# Patient Record
Sex: Female | Born: 1995 | Race: White | Hispanic: No | Marital: Single | Smoking: Never smoker
Health system: Southern US, Community
[De-identification: ages and names within clinical notes are randomized; demographics above are authoritative.]

## PROBLEM LIST (undated history)

## (undated) ENCOUNTER — Emergency Department (HOSPITAL_COMMUNITY): Admission: EM | Discharge status: Absent without leave | Payer: Self-pay

## (undated) DIAGNOSIS — N200 Calculus of kidney: Secondary | ICD-10-CM

## (undated) DIAGNOSIS — J45909 Unspecified asthma, uncomplicated: Secondary | ICD-10-CM

## (undated) DIAGNOSIS — F3181 Bipolar II disorder: Secondary | ICD-10-CM

## (undated) DIAGNOSIS — E282 Polycystic ovarian syndrome: Secondary | ICD-10-CM

## (undated) HISTORY — DX: Bipolar II disorder: F31.81

## (undated) HISTORY — DX: Polycystic ovarian syndrome: E28.2

---
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
## 2013-01-18 ENCOUNTER — Telehealth: Payer: Self-pay | Admitting: Family Medicine

## 2013-01-18 NOTE — Telephone Encounter (Signed)
Okay to schedule

## 2013-01-18 NOTE — Telephone Encounter (Signed)
Pt has sore throat, swollen glands, not well. Coming home form college fri and needs Fri afternoon appt. Pls advise ok to schedule?

## 2013-01-18 NOTE — Telephone Encounter (Signed)
appt made

## 2013-01-20 ENCOUNTER — Encounter: Payer: Self-pay | Admitting: Family Medicine

## 2013-01-20 ENCOUNTER — Ambulatory Visit (INDEPENDENT_AMBULATORY_CARE_PROVIDER_SITE_OTHER): Payer: Managed Care, Other (non HMO) | Admitting: Family Medicine

## 2013-01-20 ENCOUNTER — Ambulatory Visit: Payer: Self-pay | Admitting: Family Medicine

## 2013-01-20 VITALS — BP 120/60 | HR 116 | Temp 99.3°F | Wt 263.0 lb

## 2013-01-20 DIAGNOSIS — J019 Acute sinusitis, unspecified: Secondary | ICD-10-CM

## 2013-01-20 MED ORDER — PROMETHAZINE HCL 25 MG PO TABS: 25.0000 mg | ORAL_TABLET | ORAL | Status: DC | PRN

## 2013-01-20 MED ORDER — AZITHROMYCIN 250 MG PO TABS: ORAL_TABLET | ORAL | Status: DC

## 2013-01-20 NOTE — Progress Notes (Signed)
  Subjective:    Patient ID: Kristi Ewing, female    DOB: [birth year], 17 y.o.   MRN: [id number]  HPI Here for 5 days of fever, sinus pressure, PND, ST, and a dry cough.    Review of Systems  Constitutional: Positive for fever and fatigue.  HENT: Positive for congestion, postnasal drip and sinus pressure.   Eyes: Negative.   Respiratory: Positive for cough.        Objective:   Physical Exam  Constitutional: She appears well-developed and well-nourished.  HENT:  Right Ear: External ear normal.  Left Ear: External ear normal.  Nose: Nose normal.  Mouth/Throat: Oropharynx is clear and moist.  Eyes: Conjunctivae are normal.  Pulmonary/Chest: Effort normal and breath sounds normal.  Lymphadenopathy:    She has no cervical adenopathy.          Assessment & Plan:  Add Mucinex prn

## 2013-01-30 ENCOUNTER — Telehealth: Payer: Self-pay

## 2013-01-30 NOTE — Telephone Encounter (Signed)
Pt was given a z-pak last week and now has a rash on rt side of her neck. Mom wants to know what she should do because is away at college

## 2013-01-30 NOTE — Telephone Encounter (Signed)
It is hard to say what this rash is from. I doubt it is an allergic reaction to the Zpack she took because it is too localized and not all over her body. Tell her to take some Benadryl every 4 hours and let us know if anything else changes

## 2013-01-30 NOTE — Telephone Encounter (Signed)
I left voice message with below information. 

## 2013-02-17 ENCOUNTER — Ambulatory Visit (INDEPENDENT_AMBULATORY_CARE_PROVIDER_SITE_OTHER): Payer: Managed Care, Other (non HMO)

## 2013-02-17 DIAGNOSIS — Z23 Encounter for immunization: Secondary | ICD-10-CM

## 2013-09-12 ENCOUNTER — Telehealth: Payer: Self-pay | Admitting: Family Medicine

## 2013-09-12 NOTE — Telephone Encounter (Signed)
Dad states pt hurt her knee at school. Won't be home until Friday afternoon. Would like an appt to get knee checked out. Ok to schedule?

## 2013-09-12 NOTE — Telephone Encounter (Signed)
Pt aware.

## 2013-09-12 NOTE — Telephone Encounter (Signed)
LMOM for dad aout appt on Fri. Scheduled appt

## 2013-09-12 NOTE — Telephone Encounter (Signed)
Okay to schedule

## 2013-09-15 ENCOUNTER — Ambulatory Visit: Payer: Managed Care, Other (non HMO) | Admitting: Family Medicine

## 2013-09-16 ENCOUNTER — Encounter: Payer: Self-pay | Admitting: Family Medicine

## 2013-09-16 ENCOUNTER — Ambulatory Visit (INDEPENDENT_AMBULATORY_CARE_PROVIDER_SITE_OTHER): Payer: Managed Care, Other (non HMO) | Admitting: Family Medicine

## 2013-09-16 VITALS — BP 100/64 | HR 61 | Temp 98.3°F | Wt 291.0 lb

## 2013-09-16 DIAGNOSIS — S8390XA Sprain of unspecified site of unspecified knee, initial encounter: Secondary | ICD-10-CM

## 2013-09-16 DIAGNOSIS — IMO0002 Reserved for concepts with insufficient information to code with codable children: Secondary | ICD-10-CM

## 2013-09-16 NOTE — Progress Notes (Signed)
   Subjective:    Patient ID: Kristi Ewing, female    DOB: [birth year], 18 y.o.   MRN: [id number]  HPI Here for pain and swelling in the left knee after an injury at home about 10 days ago. She was stooping over to pick up something on the floor when she heard and felt a "pop" in the knee. She immediately felt pain in the anterior knee and it swelled up. Since then the knee swells every day toward the end of the day and then this is down the next morning. It does not give way but it feels unstable to her. It locks in position briefly at times. She is using ice and Advil .    Review of Systems  Constitutional: Negative.   Musculoskeletal: Positive for arthralgias and joint swelling.       Objective:   Physical Exam  Constitutional: She appears well-developed and well-nourished.  Walks with a slight limp.   Musculoskeletal:  The left knee shows no obvious swelling. No erythema. She is tender around the patella and in both joint spaces. She has full extension but flexion is limited by pain. Anterior drawer is negative.           Assessment & Plan:  Knee sprain, possibly a torn meniscus. She will take it easy this weekend. Wear a knee brace for support. We will refer her to see Orthopedics next week.

## 2013-11-06 ENCOUNTER — Encounter: Payer: Self-pay | Admitting: Family Medicine

## 2013-11-06 ENCOUNTER — Ambulatory Visit (INDEPENDENT_AMBULATORY_CARE_PROVIDER_SITE_OTHER): Payer: Managed Care, Other (non HMO) | Admitting: Family Medicine

## 2013-11-06 VITALS — BP 100/58 | HR 96 | Temp 98.8°F | Ht 67.25 in | Wt 296.8 lb

## 2013-11-06 DIAGNOSIS — Z23 Encounter for immunization: Secondary | ICD-10-CM

## 2013-11-06 DIAGNOSIS — M25569 Pain in unspecified knee: Secondary | ICD-10-CM

## 2013-11-06 DIAGNOSIS — Z111 Encounter for screening for respiratory tuberculosis: Secondary | ICD-10-CM

## 2013-11-06 DIAGNOSIS — Z0000 Encounter for general adult medical examination without abnormal findings: Secondary | ICD-10-CM

## 2013-11-06 LAB — POCT URINALYSIS DIPSTICK
BILIRUBIN UA: NEGATIVE
Glucose, UA: NEGATIVE
KETONES UA: NEGATIVE
Nitrite, UA: NEGATIVE
Protein, UA: NEGATIVE
Spec Grav, UA: >=1.03
Urobilinogen, UA: 0.2
pH, UA: 6

## 2013-11-06 NOTE — Progress Notes (Signed)
   Subjective:    Patient ID: Kristi Ewing, female    DOB: July 19, 1995, 18 y.o.   MRN: [id number]  HPI 18 yr old female for a cpx and for several other issues. 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 She needs some forms filled out for this. Also she was here 2 months ago for an injury to the right knee. She has not improved at all and she still has pain in this knee every day.    Review of Systems  Constitutional: Negative.   HENT: Negative.   Eyes: Negative.   Respiratory: Negative.   Cardiovascular: Negative.   Gastrointestinal: Negative.   Genitourinary: Negative for dysuria, urgency, frequency, hematuria, flank pain, decreased urine volume, enuresis, difficulty urinating, pelvic pain and dyspareunia.  Musculoskeletal: Positive for arthralgias. Negative for back pain, gait problem, joint swelling, myalgias, neck pain and neck stiffness.  Skin: Negative.   Neurological: Negative.   Psychiatric/Behavioral: Negative.        Objective:   Physical Exam  Constitutional: She is oriented to person, place, and time. She appears well-developed and well-nourished. No distress.  HENT:  Head: Normocephalic and atraumatic.  Right Ear: External ear normal.  Left Ear: External ear normal.  Nose: Nose normal.  Mouth/Throat: Oropharynx is clear and moist. No oropharyngeal exudate.  Eyes: Conjunctivae and EOM are normal. Pupils are equal, round, and reactive to light. No scleral icterus.  Neck: Normal range of motion. Neck supple. No JVD present. No thyromegaly present.  Cardiovascular: Normal rate, regular rhythm, normal heart sounds and intact distal pulses.  Exam reveals no gallop and no friction rub.   No murmur heard. Pulmonary/Chest: Effort normal and breath sounds normal. No respiratory distress. She has no wheezes. She has no rales. She exhibits no tenderness.  Abdominal: Soft. Bowel sounds are normal. She exhibits no distension  and no mass. There is no tenderness. There is no rebound and no guarding.  Musculoskeletal: Normal range of motion. She exhibits no edema and no tenderness.  Lymphadenopathy:    She has no cervical adenopathy.  Neurological: She is alert and oriented to person, place, and time. She has normal reflexes. No cranial nerve deficit. She exhibits normal muscle tone. Coordination normal.  Skin: Skin is warm and dry. No rash noted. No erythema.  Psychiatric: She has a normal mood and affect. Her behavior is normal. Judgment and thought content normal.          Assessment & Plan:  Well exam. She is given a meningitis vaccine and a PPD today. She will return in 48 hours to read the PPD. Refer to Orthopedics for the knee pain.

## 2013-11-06 NOTE — Addendum Note (Signed)
Addended by: Harl Bowie on: 11/06/2013 04:29 PM   Modules accepted: Orders

## 2013-11-19 ENCOUNTER — Emergency Department (HOSPITAL_COMMUNITY)
Admission: EM | Admit: 2013-11-19 | Discharge: 2013-11-19 | Disposition: A | Discharge status: Home / Self Care | Payer: Managed Care, Other (non HMO) | Attending: Emergency Medicine | Admitting: Emergency Medicine

## 2013-11-19 ENCOUNTER — Encounter (HOSPITAL_COMMUNITY): Payer: Self-pay | Admitting: Emergency Medicine

## 2013-11-19 ENCOUNTER — Emergency Department (HOSPITAL_COMMUNITY): Payer: Managed Care, Other (non HMO)

## 2013-11-19 DIAGNOSIS — N201 Calculus of ureter: Secondary | ICD-10-CM | POA: Insufficient documentation

## 2013-11-19 DIAGNOSIS — Z3202 Encounter for pregnancy test, result negative: Secondary | ICD-10-CM | POA: Insufficient documentation

## 2013-11-19 DIAGNOSIS — Z79899 Other long term (current) drug therapy: Secondary | ICD-10-CM | POA: Insufficient documentation

## 2013-11-19 DIAGNOSIS — N23 Unspecified renal colic: Secondary | ICD-10-CM | POA: Insufficient documentation

## 2013-11-19 DIAGNOSIS — Z90710 Acquired absence of both cervix and uterus: Secondary | ICD-10-CM | POA: Insufficient documentation

## 2013-11-19 DIAGNOSIS — J45909 Unspecified asthma, uncomplicated: Secondary | ICD-10-CM | POA: Insufficient documentation

## 2013-11-19 HISTORY — DX: Calculus of kidney: N20.0

## 2013-11-19 LAB — URINALYSIS, ROUTINE W REFLEX MICROSCOPIC
Bilirubin Urine: NEGATIVE
Glucose, UA: NEGATIVE mg/dL
Ketones, ur: NEGATIVE mg/dL
Nitrite: NEGATIVE
Protein, ur: 30 mg/dL — AB
Specific Gravity, Urine: 1.022 (ref 1.005–1.030)
Urobilinogen, UA: 0.2 mg/dL (ref 0.0–1.0)
pH: 5.5 (ref 5.0–8.0)

## 2013-11-19 LAB — CBC WITH DIFFERENTIAL/PLATELET
Basophils Absolute: 0 10*3/uL (ref 0.0–0.1)
Basophils Relative: 0 % (ref 0–1)
Eosinophils Absolute: 0 10*3/uL (ref 0.0–0.7)
Eosinophils Relative: 0 % (ref 0–5)
HCT: 38.9 % (ref 36.0–46.0)
Hemoglobin: 13 g/dL (ref 12.0–15.0)
Lymphocytes Relative: 29 % (ref 12–46)
Lymphs Abs: 2.3 10*3/uL (ref 0.7–4.0)
MCH: 28.9 pg (ref 26.0–34.0)
MCHC: 33.4 g/dL (ref 30.0–36.0)
MCV: 86.4 fL (ref 78.0–100.0)
Monocytes Absolute: 0.8 10*3/uL (ref 0.1–1.0)
Monocytes Relative: 9 % (ref 3–12)
Neutro Abs: 5 10*3/uL (ref 1.7–7.7)
Neutrophils Relative %: 62 % (ref 43–77)
Platelets: 304 10*3/uL (ref 150–400)
RBC: 4.5 MIL/uL (ref 3.87–5.11)
RDW: 12.7 % (ref 11.5–15.5)
WBC: 8.1 10*3/uL (ref 4.0–10.5)

## 2013-11-19 LAB — PREGNANCY, URINE: Preg Test, Ur: NEGATIVE

## 2013-11-19 LAB — BASIC METABOLIC PANEL
BUN: 12 mg/dL (ref 6–23)
CO2: 23 mEq/L (ref 19–32)
Calcium: 9.7 mg/dL (ref 8.4–10.5)
Chloride: 101 mEq/L (ref 96–112)
Creatinine, Ser: 0.93 mg/dL (ref 0.50–1.10)
GFR calc Af Amer: >90 mL/min (ref >90)
GFR calc non Af Amer: 89 mL/min — ABNORMAL LOW (ref >90)
Glucose, Bld: 93 mg/dL (ref 70–99)
Potassium: 4 mEq/L (ref 3.7–5.3)
Sodium: 139 mEq/L (ref 137–147)

## 2013-11-19 LAB — URINE MICROSCOPIC-ADD ON

## 2013-11-19 MED ORDER — IBUPROFEN 400 MG PO TABS: 600.0000 mg | ORAL_TABLET | Freq: Once | ORAL | Status: DC

## 2013-11-19 MED ORDER — HYDROMORPHONE HCL PF 1 MG/ML IJ SOLN
1.0000 mg | Freq: Once | INTRAMUSCULAR | Status: AC
  Administered 2013-11-19: 1 mg via INTRAVENOUS
  Filled 2013-11-19: qty 1

## 2013-11-19 MED ORDER — ONDANSETRON HCL 4 MG/2ML IJ SOLN
4.0000 mg | Freq: Once | INTRAMUSCULAR | Status: AC
  Administered 2013-11-19: 4 mg via INTRAVENOUS
  Filled 2013-11-19: qty 2

## 2013-11-19 MED ORDER — ONDANSETRON HCL 4 MG PO TABS: 4.0000 mg | ORAL_TABLET | Freq: Four times a day (QID) | ORAL | Status: DC

## 2013-11-19 MED ORDER — KETOROLAC TROMETHAMINE 15 MG/ML IJ SOLN
15.0000 mg | Freq: Once | INTRAMUSCULAR | Status: AC
  Administered 2013-11-19: 15 mg via INTRAVENOUS
  Filled 2013-11-19: qty 1

## 2013-11-19 MED ORDER — OXYCODONE-ACETAMINOPHEN 5-325 MG PO TABS: 1.0000 | ORAL_TABLET | ORAL | Status: DC | PRN

## 2013-11-19 MED ORDER — OXYCODONE-ACETAMINOPHEN 5-325 MG PO TABS: 2.0000 | ORAL_TABLET | Freq: Once | ORAL | Status: DC

## 2013-11-19 NOTE — ED Notes (Signed)
Per pt sts left flank and lower back pain. Denies hematuria. sts hx of stones.

## 2013-11-19 NOTE — Discharge Instructions (Signed)

## 2013-11-22 NOTE — ED Provider Notes (Signed)
CSN: [id number]     Arrival date & time 11/19/13  1408 History   First MD Initiated Contact with Patient 11/19/13 1436     Chief Complaint  Patient presents with  . Flank Pain     (Consider location/radiation/quality/duration/timing/severity/associated sxs/prior Treatment) HPI  18 year old female left flank pain. Onset almost a week ago Radiates into her left midback. Patient has a past history kidney stones and says her current symptoms feel similar. No urinary complaints. No hematuria. No fevers or chills. Some mild nausea and the pain as severe, but no vomiting. No diarrhea.  Past Medical History  Diagnosis Date  . Asthma   . Kidney stone    Past Surgical History  Procedure Laterality Date  . Abdominal hysterectomy     Family History  Problem Relation Age of Onset  . Diabetes    . Anxiety disorder    . Hypertension Mother   . Diabetes Father   . Hypertension Father    History  Substance Use Topics  . Smoking status: Never Smoker   . Smokeless tobacco: Never Used  . Alcohol Use: No   OB History   Grav Para Term Preterm Abortions TAB SAB Ect Mult Living                 Review of Systems  All systems reviewed and negative, other than as noted in HPI.   Allergies  Review of patient's allergies indicates no known allergies.  Home Medications   Prior to Admission medications   Medication Sig Start Date End Date Taking? Authorizing Provider  HYDROcodone-acetaminophen (NORCO/VICODIN) 5-325 MG per tablet Take 1 tablet by mouth every 6 (six) hours as needed for moderate pain.   Yes Historical Provider, MD  silodosin (RAPAFLO) 4 MG CAPS capsule Take 4 mg by mouth daily with breakfast.   Yes Historical Provider, MD  ondansetron (ZOFRAN) 4 MG tablet Take 1 tablet (4 mg total) by mouth every 6 (six) hours. 11/19/13   Virgel Manifold, MD  oxyCODONE-acetaminophen (PERCOCET/ROXICET) 5-325 MG per tablet Take 1-2 tablets by mouth every 4 (four) hours as needed for severe pain.  11/19/13   Virgel Manifold, MD   BP 110/72  Pulse 62  Temp(Src) 98.7 F (37.1 C) (Oral)  Resp 16  Ht 5' 4"  (1.626 m)  Wt 227 lb 7 oz (103.165 kg)  BMI 39.02 kg/m2  SpO2 98%  LMP 10/05/2013 Physical Exam  Nursing note and vitals reviewed. Constitutional: She appears well-developed and well-nourished. No distress.  HENT:  Head: Normocephalic and atraumatic.  Eyes: Conjunctivae are normal. Right eye exhibits no discharge. Left eye exhibits no discharge.  Neck: Neck supple.  Cardiovascular: Normal rate, regular rhythm and normal heart sounds.  Exam reveals no gallop and no friction rub.   No murmur heard. Pulmonary/Chest: Effort normal and breath sounds normal. No respiratory distress.  Abdominal: Soft. She exhibits no distension. There is no tenderness.  Genitourinary:  L CVA tenderness  Musculoskeletal: She exhibits no edema and no tenderness.  Neurological: She is alert.  Skin: Skin is warm and dry.  Psychiatric: She has a normal mood and affect. Her behavior is normal. Thought content normal.    ED Course  Procedures (including critical care time) Labs Review Labs Reviewed  URINALYSIS, ROUTINE W REFLEX MICROSCOPIC - Abnormal; Notable for the following:    Hgb urine dipstick SMALL (*)    Protein, ur 30 (*)    Leukocytes, UA SMALL (*)    All other components within normal limits  BASIC METABOLIC PANEL - Abnormal; Notable for the following:    GFR calc non Af Amer 89 (*)    All other components within normal limits  URINE MICROSCOPIC-ADD ON - Abnormal; Notable for the following:    Squamous Epithelial / LPF FEW (*)    All other components within normal limits  PREGNANCY, URINE  CBC WITH DIFFERENTIAL    Imaging Review No results found.  Ct Abdomen Pelvis Wo Contrast  11/19/2013   CLINICAL DATA:  Left flank pain, lower back pain  EXAM: CT ABDOMEN AND PELVIS WITHOUT CONTRAST  TECHNIQUE: Multidetector CT imaging of the abdomen and pelvis was performed following the  standard protocol without IV contrast.  COMPARISON:  11/18/2012  FINDINGS: Lung bases are unremarkable. Sagittal images of the spine are unremarkable.  Unenhanced liver is unremarkable. Pancreas, spleen and adrenals are unremarkable. No calcified gallstones are noted within gallbladder. There is mild left hydronephrosis and left hydroureter. Nonobstructive calcified calculus midpole of the left kidney measures 4.8 mm. Bilateral no proximal ureteral calculi are noted.  In axial image 86 there is a calcified obstructive calculus in left UVJ. No calcified calculi are noted within urinary bladder. The right ureter is unremarkable. Unenhanced uterus and adnexa are unremarkable.  No small bowel obstruction. No ascites or free air. No adenopathy. No pericecal inflammation. Normal appendix. The terminal ileum is unremarkable. No destructive bony lesions are noted within pelvis. Small umbilical hernia containing fat without evidence of acute complication.  IMPRESSION: 1. There is mild left hydronephrosis and left hydroureter. 2. There is 4 mm calcified obstructive calculus in left UVJ. 3. Right nonobstructive nephrolithiasis. 4. Normal appendix.  No pericecal inflammation. 5. No small bowel obstruction.   Electronically Signed   By: Lahoma Crocker M.D.   On: 11/19/2013 16:41    EKG Interpretation None      MDM   Final diagnoses:  Ureteral colic    35HG with L flank pain. W/u significant for obstructing  Distal ureteral stone. Symptoms controlled. Renal function fine. Give duration of symptoms, may need intervention. Urology information provided.     Virgel Manifold, MD 11/22/13 [phone]

## 2014-03-09 ENCOUNTER — Ambulatory Visit: Payer: Self-pay | Admitting: Family Medicine

## 2014-03-23 ENCOUNTER — Ambulatory Visit (INDEPENDENT_AMBULATORY_CARE_PROVIDER_SITE_OTHER): Payer: Managed Care, Other (non HMO) | Admitting: Internal Medicine

## 2014-03-23 ENCOUNTER — Encounter: Payer: Self-pay | Admitting: Internal Medicine

## 2014-03-23 VITALS — BP 120/80 | HR 90 | Temp 98.4°F | Resp 20 | Ht 68.0 in | Wt 300.0 lb

## 2014-03-23 DIAGNOSIS — J0101 Acute recurrent maxillary sinusitis: Secondary | ICD-10-CM

## 2014-03-23 MED ORDER — AMOXICILLIN-POT CLAVULANATE 875-125 MG PO TABS: 1.0000 | ORAL_TABLET | Freq: Two times a day (BID) | ORAL | Status: AC

## 2014-03-23 NOTE — Progress Notes (Signed)
Pre visit review using our clinic review tool, if applicable. No additional management support is needed unless otherwise documented below in the visit note. 

## 2014-03-23 NOTE — Patient Instructions (Signed)
Use saline irrigation, warm  moist compresses and over-the-counter decongestants only as directed.  Call if there is no improvement in 5 to 7 days, or sooner if you develop increasing pain, fever, or any new symptoms.  Continue Mucinex D twice daily  Take your antibiotic as prescribed until ALL of it is gone, but stop if you develop a rash, swelling, or any side effects of the medication.  Contact our office as soon as possible if  there are side effects of the medication.

## 2014-03-23 NOTE — Progress Notes (Signed)
Subjective:    Patient ID: Kristi Ewing, female    DOB: September 01, 1995, 18 y.o.   MRN: [id number]  HPI 18 year old patient who presents with a several day history of nasal congestion, green purulent drainage and fullness in the right ear and right maxillary sinus area.  She states that she has required treatment for sinus infections in the past.  There's been no documented fever.  She has been using Advil and Mucinex D and continues to have worsening congestion and focal discomfort.  Denies any allergies.  Past Medical History  Diagnosis Date  . Asthma   . Kidney stone     History   Social History  . Marital Status: Single    Spouse Name: N/A    Number of Children: N/A  . Years of Education: N/A   Occupational History  . Not on file.   Social History Main Topics  . Smoking status: Never Smoker   . Smokeless tobacco: Never Used  . Alcohol Use: No  . Drug Use: No  . Sexual Activity: Not on file   Other Topics Concern  . Not on file   Social History Narrative  . No narrative on file    Past Surgical History  Procedure Laterality Date  . Abdominal hysterectomy      Family History  Problem Relation Age of Onset  . Diabetes    . Anxiety disorder    . Hypertension Mother   . Diabetes Father   . Hypertension Father     No Known Allergies  No current outpatient prescriptions on file prior to visit.   No current facility-administered medications on file prior to visit.    BP 120/80  Pulse 90  Temp(Src) 98.4 F (36.9 C) (Oral)  Resp 20  Ht 5\' 8"  (1.727 m)  Wt 300 lb (136.079 kg)  BMI 45.63 kg/m2  SpO2 98%      Review of Systems  Constitutional: Positive for activity change, appetite change and fatigue. Negative for fever, chills and diaphoresis.  HENT: Positive for congestion, postnasal drip and rhinorrhea. Negative for dental problem, hearing loss, sinus pressure, sore throat and tinnitus.   Eyes: Negative for pain, discharge and visual  disturbance.  Respiratory: Negative for cough and shortness of breath.   Cardiovascular: Negative for chest pain, palpitations and leg swelling.  Gastrointestinal: Negative for nausea, vomiting, abdominal pain, diarrhea, constipation, blood in stool and abdominal distention.  Genitourinary: Negative for dysuria, urgency, frequency, hematuria, flank pain, vaginal bleeding, vaginal discharge, difficulty urinating, vaginal pain and pelvic pain.  Musculoskeletal: Negative for arthralgias, gait problem and joint swelling.  Skin: Negative for rash.  Neurological: Positive for headaches. Negative for dizziness, syncope, speech difficulty, weakness and numbness.  Hematological: Negative for adenopathy.  Psychiatric/Behavioral: Negative for behavioral problems, dysphoric mood and agitation. The patient is not nervous/anxious.        Objective:   Physical Exam  Constitutional: She is oriented to person, place, and time. She appears well-developed and well-nourished.  HENT:  Head: Normocephalic.  Right Ear: External ear normal.  Left Ear: External ear normal.  Mouth/Throat: Oropharynx is clear and moist.  Tenderness over right maxillary sinus area Oral pharynx only minimally injected  Both tympanic membranes normal  Eyes: Conjunctivae and EOM are normal. Pupils are equal, round, and reactive to light.  Neck: Normal range of motion. Neck supple. No thyromegaly present.  Cardiovascular: Normal rate, regular rhythm, normal heart sounds and intact distal pulses.   Pulmonary/Chest: Effort normal and breath sounds  normal.  Abdominal: Soft. Bowel sounds are normal. She exhibits no mass. There is no tenderness.  Musculoskeletal: Normal range of motion.  Lymphadenopathy:    She has no cervical adenopathy.  Neurological: She is alert and oriented to person, place, and time.  Skin: Skin is warm and dry. No rash noted.  Psychiatric: She has a normal mood and affect. Her behavior is normal.            Assessment & Plan:   Sinusitis.  Will treat with Augmentin.  Continue expectorants and decongestants.  Nasal saline rinse

## 2014-05-15 IMAGING — CT CT ABD-PELV W/O CM
1 series · 15 of 31 positions shown, 19 images · non-contrast
Comparison: None.

CLINICAL DATA: Right-sided flank pain

CT ABDOMEN AND PELVIS WITHOUT CONTRAST
TECHNIQUE: Multidetector CT imaging of the abdomen and pelvis was
performed following the standard protocol without intravenous
contrast.

[Series 4: lung · axial · 0.93mm/px · z∈[+1610,+1745]mm · 15 of 31 slices shown, 19 images]
[im 3/31  soft-tissue]
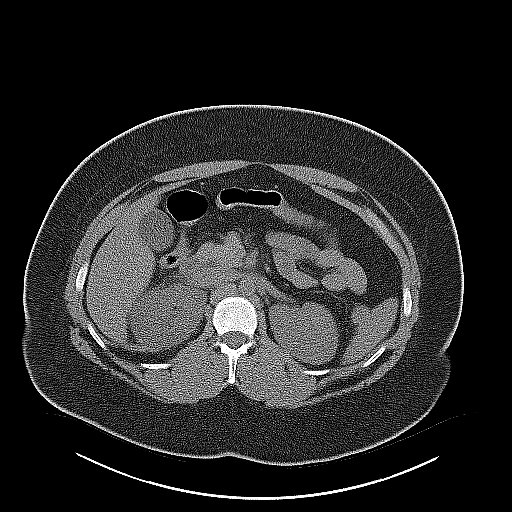
[im 3/31  bone]
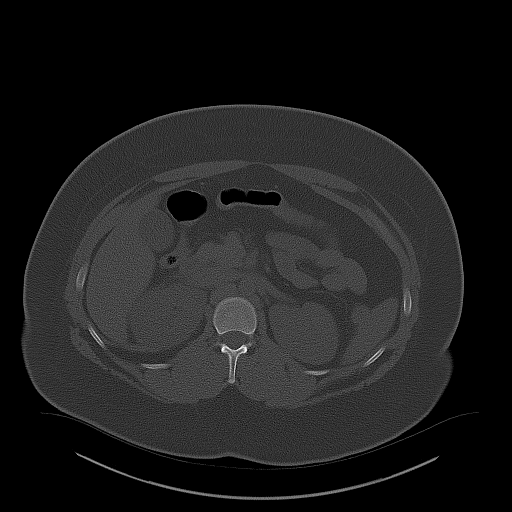
[im 5/31  soft-tissue]
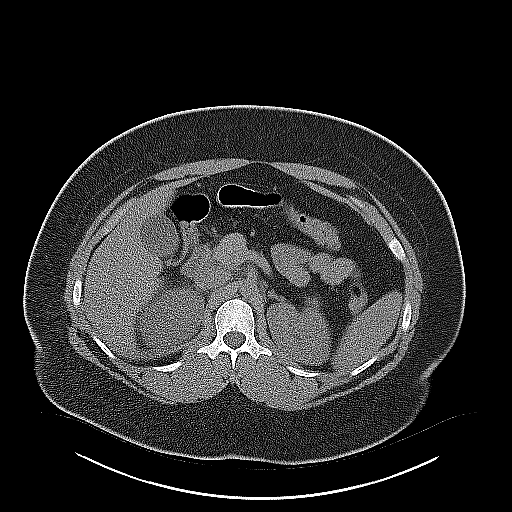
[im 7/31  soft-tissue]
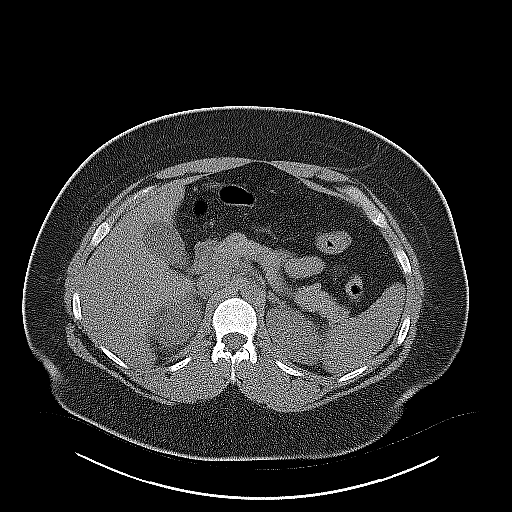
[im 9/31  soft-tissue]
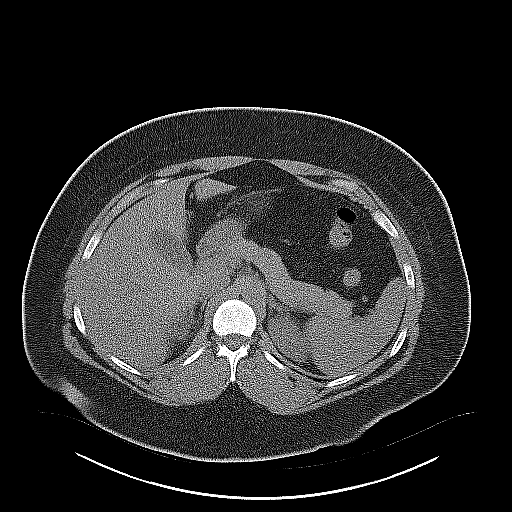
[im 12/31  soft-tissue]
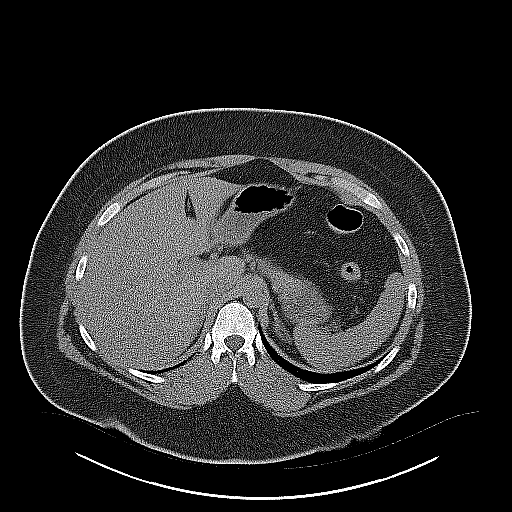
[im 14/31  soft-tissue]
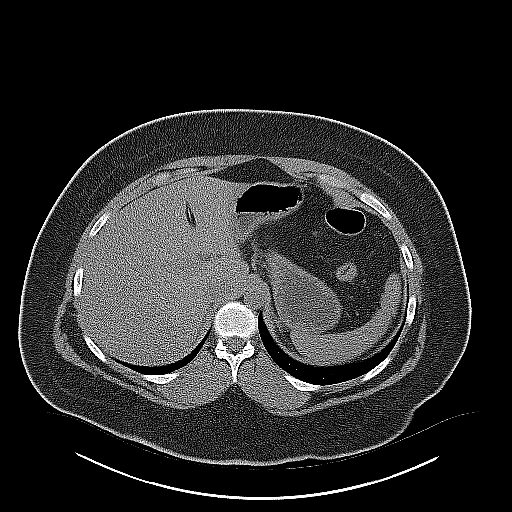
[im 16/31  soft-tissue]
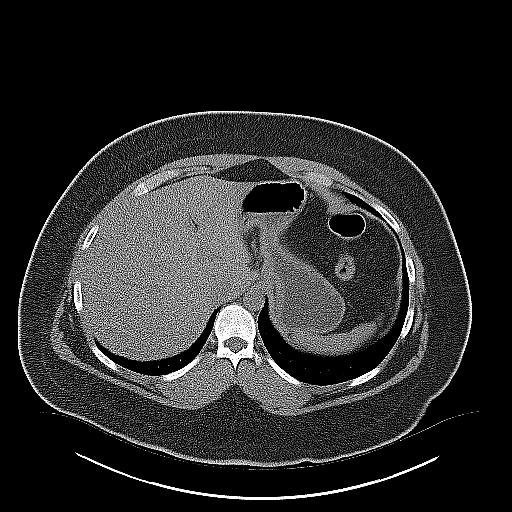
[im 18/31  soft-tissue]
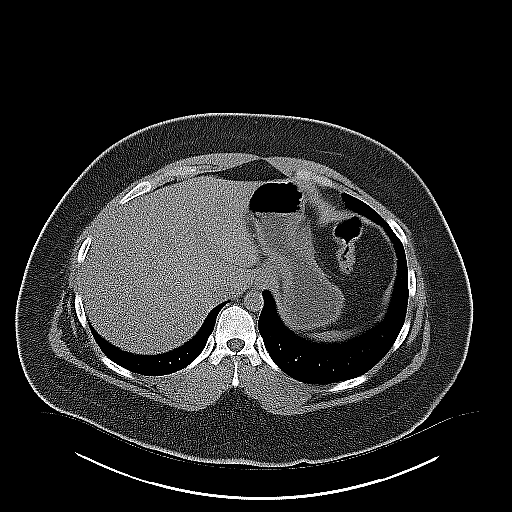
[im 20/31  soft-tissue]
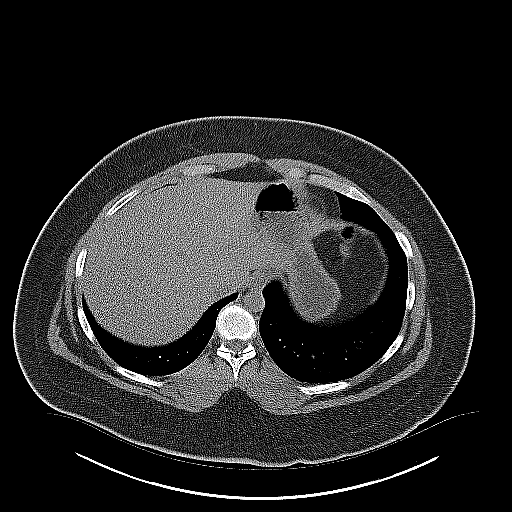
[im 20/31  bone]
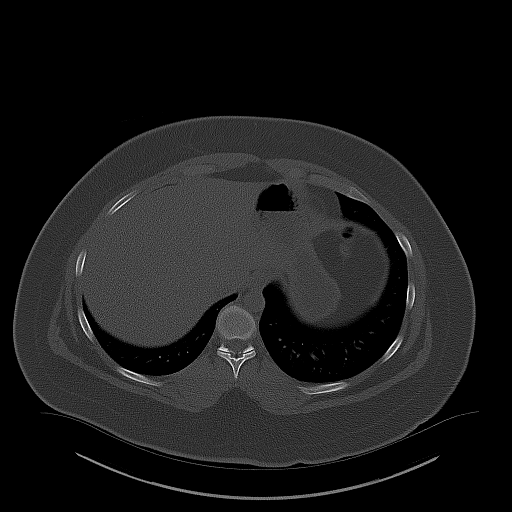
[im 23/31  soft-tissue]
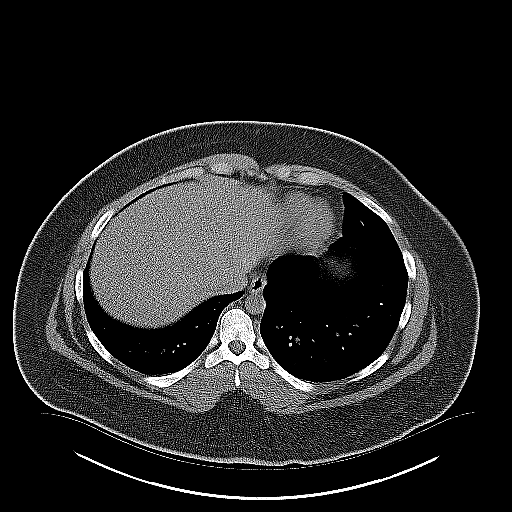
[im 25/31  soft-tissue]
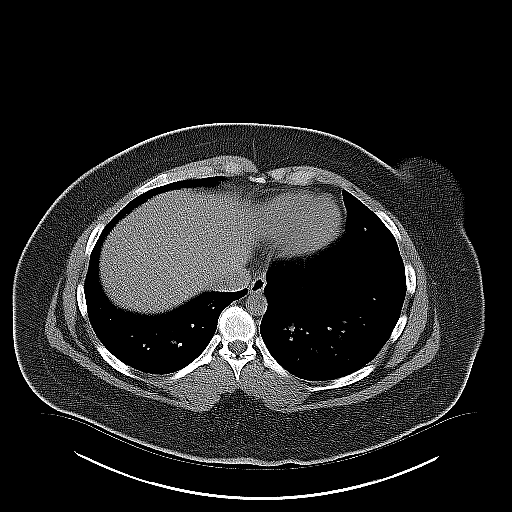
[im 27/31  soft-tissue]
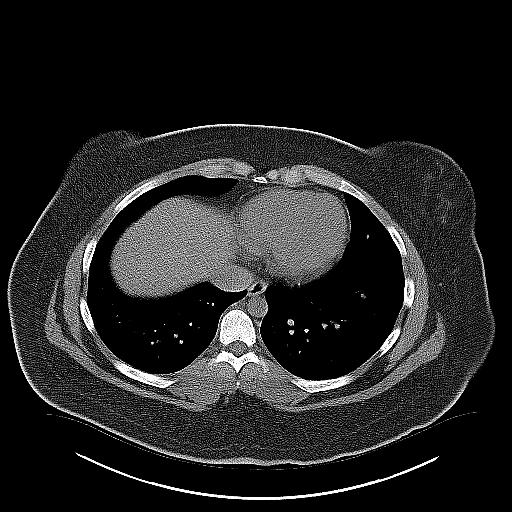
[im 27/31  lung]
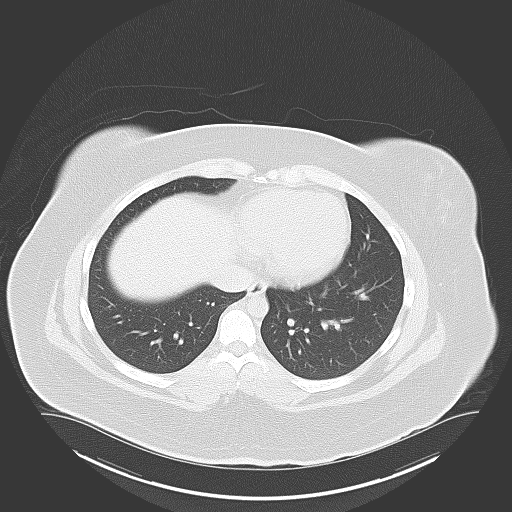
[im 28/31  lung]
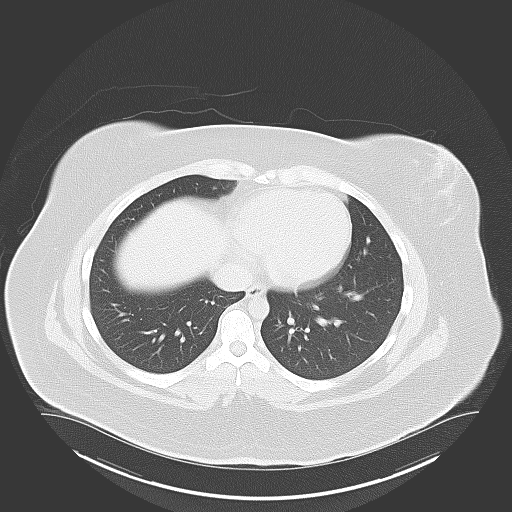
[im 29/31  soft-tissue]
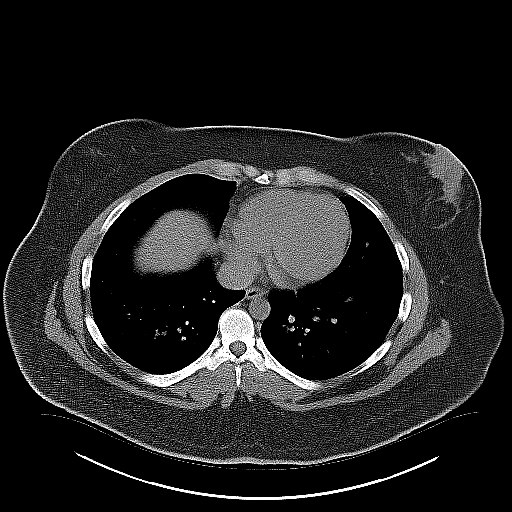
[im 29/31  lung]
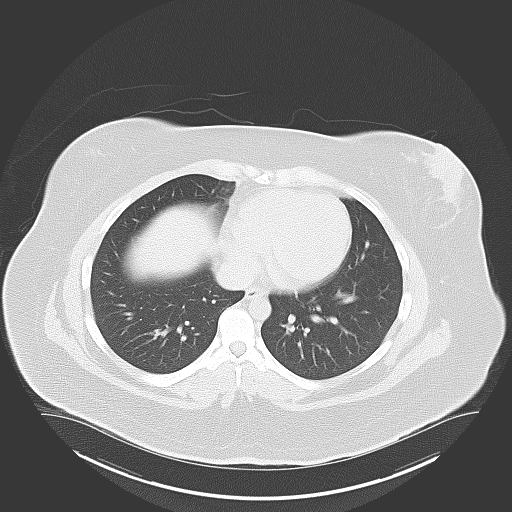
[im 30/31  lung]
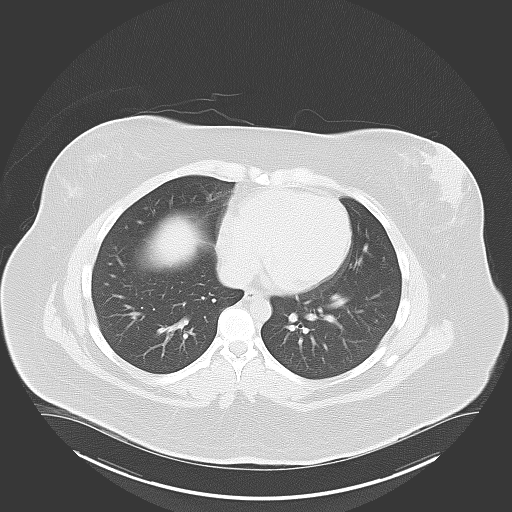

[15 of 31 positions shown; findings below may reference images not displayed]

FINDINGS: The lack of intravenous contrast limits the ability to evaluate
solid abdominal organs.

There is a punctate (approximately 3 mm) stone at the level of the
right UPJ (image 81, series 2 which results in mild to moderate
upstream ureterectasis and pyelocaliectasis.  This find associated
with a very minimal amount of asymmetric right-sided perinephric
stranding.  There is an additional nonobstructing stone within the
mid/inferior aspect of the right kidney (image 37, series 2).  No
definite left-sided renal stones.  The left-sided urinary
obstruction.  Normal noncontrast appearance of the urinary bladder
given under distension.

Normal hepatic contour.  Normal noncontrast appearance of the
gallbladder.  No ascites.

Normal noncontrast appearance of the bilateral adrenal glands,
pancreas and spleen.

The bowel is normal in course and caliber without wall thickening
or evidence of obstruction.  Normal noncontrast appearance of the
appendix.  No pneumoperitoneum, pneumatosis or portal venous gas.

Normal caliber of the abdominal aorta.  No definite
retroperitoneal, mesenteric, pelvic or inguinal lymphadenopathy on
this noncontrast examination.

Limited visualization of the lower thorax is made of focal airspace
opacity.

No acute or aggressive osseous abnormalities.
IMPRESSION: 1.  Punctate (approximately 3 mm) nonobstructing stone at the level
of the right UVJ results in mild to moderate upstream ureterectasis
and pelvocaliectasis.
2.  Additional punctate (approximately 4 mm) nonobstructing right-
sided renal stone.
3.  No left-sided nephrolithiasis or urinary obstruction.

## 2014-08-24 ENCOUNTER — Ambulatory Visit (INDEPENDENT_AMBULATORY_CARE_PROVIDER_SITE_OTHER): Payer: Managed Care, Other (non HMO) | Admitting: Internal Medicine

## 2014-08-24 ENCOUNTER — Encounter: Payer: Self-pay | Admitting: Internal Medicine

## 2014-08-24 ENCOUNTER — Other Ambulatory Visit (INDEPENDENT_AMBULATORY_CARE_PROVIDER_SITE_OTHER): Payer: Managed Care, Other (non HMO)

## 2014-08-24 ENCOUNTER — Telehealth: Payer: Self-pay | Admitting: Family Medicine

## 2014-08-24 VITALS — BP 118/78 | HR 110 | Temp 100.4°F | Resp 16 | Wt 304.0 lb

## 2014-08-24 DIAGNOSIS — R509 Fever, unspecified: Secondary | ICD-10-CM

## 2014-08-24 LAB — COMPREHENSIVE METABOLIC PANEL
ALT: 38 U/L — AB (ref 0–35)
AST: 31 U/L (ref 0–37)
Albumin: 4.2 g/dL (ref 3.5–5.2)
Alkaline Phosphatase: 58 U/L (ref 39–117)
BILIRUBIN TOTAL: 0.5 mg/dL (ref 0.2–1.2)
BUN: 10 mg/dL (ref 6–23)
CO2: 29 mEq/L (ref 19–32)
Calcium: 9.1 mg/dL (ref 8.4–10.5)
Chloride: 100 mEq/L (ref 96–112)
Creatinine, Ser: 0.89 mg/dL (ref 0.40–1.20)
GFR: 85.72 mL/min (ref >60.00)
Glucose, Bld: 83 mg/dL (ref 70–99)
Potassium: 3.7 mEq/L (ref 3.5–5.1)
Sodium: 134 mEq/L — ABNORMAL LOW (ref 135–145)
Total Protein: 7.2 g/dL (ref 6.0–8.3)

## 2014-08-24 LAB — CBC WITH DIFFERENTIAL/PLATELET
BASOS PCT: 0.1 % (ref 0.0–3.0)
Basophils Absolute: 0 10*3/uL (ref 0.0–0.1)
Eosinophils Absolute: 0 10*3/uL (ref 0.0–0.7)
Eosinophils Relative: 0 % (ref 0.0–5.0)
HEMATOCRIT: 39.2 % (ref 36.0–46.0)
HEMOGLOBIN: 13.3 g/dL (ref 12.0–15.0)
LYMPHS PCT: 23.9 % (ref 12.0–46.0)
Lymphs Abs: 0.5 10*3/uL — ABNORMAL LOW (ref 0.7–4.0)
MCHC: 34 g/dL (ref 30.0–36.0)
MCV: 83.9 fl (ref 78.0–100.0)
MONOS PCT: 16.7 % — AB (ref 3.0–12.0)
Monocytes Absolute: 0.4 10*3/uL (ref 0.1–1.0)
NEUTROS PCT: 59.3 % (ref 43.0–77.0)
Neutro Abs: 1.4 10*3/uL (ref 1.4–7.7)
Platelets: 150 10*3/uL (ref 150.0–400.0)
RBC: 4.67 Mil/uL (ref 3.87–5.11)
RDW: 12.8 % (ref 11.5–14.6)
WBC: 2.3 10*3/uL — ABNORMAL LOW (ref 4.5–10.5)

## 2014-08-24 LAB — SEDIMENTATION RATE: SED RATE: 24 mm/h — AB (ref 0–22)

## 2014-08-24 MED ORDER — DOXYCYCLINE HYCLATE 100 MG PO TBEC: 100.0000 mg | DELAYED_RELEASE_TABLET | Freq: Two times a day (BID) | ORAL | Status: DC

## 2014-08-24 MED ORDER — PROMETHAZINE HCL 12.5 MG PO TABS: 12.5000 mg | ORAL_TABLET | Freq: Four times a day (QID) | ORAL | Status: DC | PRN

## 2014-08-24 NOTE — Progress Notes (Signed)
Subjective:    Patient ID: Kristi Ewing, female    DOB: 05/28/1995, 19 y.o.   MRN: [id number]  HPI Comments: New to me she complains of a low grade fever for 4 days, she has vomited twice during this time, last episode of vomiting was about 14 hours ago, she has had mild intermittent nausea. She has myalgias, fatigue, dizziness, and lightheadedness.  Fever  Pertinent negatives include no abdominal pain, chest pain, congestion, diarrhea, ear pain, headaches, nausea, rash, sore throat, urinary pain or vomiting.  Dizziness Associated symptoms include chills, fatigue, a fever and myalgias. Pertinent negatives include no abdominal pain, arthralgias, chest pain, congestion, diaphoresis, headaches, joint swelling, nausea, neck pain, numbness, rash, sore throat, vomiting or weakness.      Review of Systems  Constitutional: Positive for fever, chills and fatigue. Negative for diaphoresis, activity change, appetite change and unexpected weight change.  HENT: Negative.  Negative for congestion, ear pain, facial swelling, sore throat, trouble swallowing and voice change.   Eyes: Negative.   Respiratory: Negative.   Cardiovascular: Negative.  Negative for chest pain, palpitations and leg swelling.  Gastrointestinal: Negative.  Negative for nausea, vomiting, abdominal pain, diarrhea, constipation and blood in stool.  Endocrine: Negative.   Genitourinary: Negative.  Negative for dysuria, urgency, frequency and difficulty urinating.  Musculoskeletal: Positive for myalgias. Negative for back pain, joint swelling, arthralgias, gait problem, neck pain and neck stiffness.  Skin: Negative.  Negative for color change and rash.  Allergic/Immunologic: Negative.   Neurological: Positive for dizziness and light-headedness. Negative for tremors, seizures, syncope, facial asymmetry, speech difficulty, weakness, numbness and headaches.  Hematological: Negative.  Negative for adenopathy. Does not  bruise/bleed easily.  Psychiatric/Behavioral: Negative.        Objective:   Physical Exam  Constitutional: She is oriented to person, place, and time. She appears well-developed and well-nourished.  Non-toxic appearance. She does not have a sickly appearance. She does not appear ill. No distress.  HENT:  Head: Normocephalic and atraumatic.  Mouth/Throat: Oropharynx is clear and moist and mucous membranes are normal. Mucous membranes are not pale, not dry and not cyanotic. No oral lesions. No trismus in the jaw. No uvula swelling. No oropharyngeal exudate, posterior oropharyngeal edema, posterior oropharyngeal erythema or tonsillar abscesses.  Eyes: Conjunctivae are normal. Right eye exhibits no discharge. Left eye exhibits no discharge. No scleral icterus.  Neck: Normal range of motion. Neck supple. No JVD present. No tracheal deviation present. No thyromegaly present.  Cardiovascular: Normal rate, regular rhythm, normal heart sounds and intact distal pulses.  Exam reveals no gallop and no friction rub.   No murmur heard. Pulmonary/Chest: Effort normal and breath sounds normal. No stridor. No respiratory distress. She has no wheezes. She has no rales. She exhibits no tenderness.  Abdominal: Soft. Bowel sounds are normal. She exhibits no distension and no mass. There is no tenderness. There is no rebound and no guarding.  Musculoskeletal: Normal range of motion. She exhibits no edema or tenderness.  Lymphadenopathy:    She has no cervical adenopathy.  Neurological: She is oriented to person, place, and time.  Skin: Skin is warm and dry. No rash noted. She is not diaphoretic. No erythema. No pallor.  Psychiatric: She has a normal mood and affect. Her behavior is normal. Judgment and thought content normal.  Vitals reviewed.   Lab Results  Component Value Date   WBC 2.3 Repeated and verified X2.* 08/24/2014   HGB 13.3 08/24/2014   HCT 39.2 08/24/2014  PLT 150.0 08/24/2014   GLUCOSE 83  08/24/2014   ALT 38* 08/24/2014   AST 31 08/24/2014   NA 134* 08/24/2014   K 3.7 08/24/2014   CL 100 08/24/2014   CREATININE 0.89 08/24/2014   BUN 10 08/24/2014   CO2 29 08/24/2014   TSH 1.42 10/15/2009        Assessment & Plan:

## 2014-08-24 NOTE — Patient Instructions (Signed)

## 2014-08-24 NOTE — Assessment & Plan Note (Signed)
She has one elevated LFT and neutropenia with monocytosis, will treat for tick born disease with doxycycline. This may also be a viral syndrome. She has had a few episodes of vomiting, will offer phenergan as needed.

## 2014-08-24 NOTE — Telephone Encounter (Signed)
Appointment scheduled at [facility] office Dr. Tamala Julian

## 2014-08-24 NOTE — Telephone Encounter (Signed)
[facility]  Patient Name: Kristi Ewing  Gender: Female  DOB: 23-Nov-1995   Age: 19 Y 3 M 2 D  Return Phone Number: [phone] (Primary), [phone] (Secondary)  Address[place]   [place] [address]   Client [facility] - Client  Client Site [facility] - Day  Physician [place], [place] Type Call  Call Type Triage / Clinical  Caller Name Maudie Mercury  Relationship To Patient Mother        Return Phone Number [phone] (Primary)  Chief Complaint Vomiting  Initial Comment Caller states her daughter has headache, vomiting, aches     PreDisposition Call Doctor      Nurse Assessment  Nurse: Shawn Stall, RN, Trish Date/Time (Eastern Time): 08/24/2014 10:48:59 AM  Confirm and document reason for call. If symptomatic, describe symptoms. ---Mom is calling for patient and Caller states her daughter has headache, vomiting, aches. temp ear 100.5. Last 24 hours 3 emesis no blood. no diarrhea. 3 days ago started with H/A and body aches. 3 days ago started with terrible H/A is having chills. She feels weak. Last urine was last night at 10 pm. Patient was called by nurse and she states her mouth is dry.  Has the patient traveled out of the country within the last 30 days? ---No  Does the patient require triage? ---Yes  Related visit to physician within the last 2 weeks? ---No  Does the PT have any chronic conditions? (i.e. diabetes, asthma, etc.) ---No  Did the patient indicate they were pregnant? ---No     Guidelines      Guideline Title Affirmed Question Affirmed Notes Nurse Date/Time Eilene Ghazi Time)  Sinus Pain Or Congestion [1] Redness or swelling on the cheek, forehead or around the eye AND [2] fever  Shawn Stall, RN, Trish 08/24/2014 10:55:54 AM   Disp. Time Eilene Ghazi Time) Disposition Final User          08/24/2014 10:59:04 AM See Physician  within 4 Hours (or PCP triage) Yes Shawn Stall, RN, Trish        Caller Understands: Yes  Disagree/Comply: Comply     Care Advice Given Per Guideline      SEE PHYSICIAN WITHIN 4 HOURS (or PCP triage): FEVER: * For fever above 102 F (39 C) or child uncomfortable, give acetaminophen every 4 hours OR ibuprofen every 6 hours (See Dosage table). CALL BACK IF: * Your child becomes worse CARE ADVICE given per Sinus Pain or Congestion (Pediatric) guideline.   After Care Instructions Given     Call Event Type User Date / Time Description        Referrals  REFERRED TO PCP OFFICE

## 2014-08-24 NOTE — Telephone Encounter (Signed)
Correction appointment with Dr. Alona Bene at the [facility] office at 2:30

## 2014-08-24 NOTE — Progress Notes (Signed)
Pre visit review using our clinic review tool, if applicable. No additional management support is needed unless otherwise documented below in the visit note. 

## 2014-08-30 ENCOUNTER — Telehealth: Payer: Self-pay | Admitting: Internal Medicine

## 2014-08-30 MED ORDER — CLOTRIMAZOLE 10 MG MT TROC: 10.0000 mg | Freq: Every day | OROMUCOSAL | Status: DC

## 2014-08-30 NOTE — Telephone Encounter (Signed)
Dr. Sarajane Jews patient.  Seen Dr. Ronnald Ramp on 4/1.  States daughter has thrush now from antibiotic and is requesting a mouth wash to be sent to [facility].

## 2014-08-30 NOTE — Telephone Encounter (Signed)
done

## 2014-12-24 ENCOUNTER — Ambulatory Visit (INDEPENDENT_AMBULATORY_CARE_PROVIDER_SITE_OTHER): Payer: Managed Care, Other (non HMO) | Admitting: Family Medicine

## 2014-12-24 ENCOUNTER — Encounter: Payer: Self-pay | Admitting: Family Medicine

## 2014-12-24 VITALS — BP 133/81 | HR 60 | Temp 98.8°F | Ht 68.0 in | Wt 303.0 lb

## 2014-12-24 DIAGNOSIS — E282 Polycystic ovarian syndrome: Secondary | ICD-10-CM | POA: Diagnosis not present

## 2014-12-24 DIAGNOSIS — Z23 Encounter for immunization: Secondary | ICD-10-CM | POA: Diagnosis not present

## 2014-12-24 DIAGNOSIS — Z0000 Encounter for general adult medical examination without abnormal findings: Secondary | ICD-10-CM | POA: Diagnosis not present

## 2014-12-24 NOTE — Progress Notes (Signed)
Pre visit review using our clinic review tool, if applicable. No additional management support is needed unless otherwise documented below in the visit note. 

## 2014-12-24 NOTE — Progress Notes (Signed)
   Subjective:    Patient ID: Kristi Ewing, female    DOB: 1995/06/14, 19 y.o.   MRN: [id number]  HPI 19 yr old female for a well exam. 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 She feels well. She was recently diagnosed with PCOS by Dr. Rana Snare and she will be starting hormonal therapy.    Review of Systems  Constitutional: Negative.   HENT: Negative.   Eyes: Negative.   Respiratory: Negative.   Cardiovascular: Negative.   Gastrointestinal: Negative.   Genitourinary: Negative for dysuria, urgency, frequency, hematuria, flank pain, decreased urine volume, enuresis, difficulty urinating, pelvic pain and dyspareunia.  Musculoskeletal: Negative.   Skin: Negative.   Neurological: Negative.   Psychiatric/Behavioral: Negative.        Objective:   Physical Exam  Constitutional: She is oriented to person, place, and time. No distress.  Morbidly obese   HENT:  Head: Normocephalic and atraumatic.  Right Ear: External ear normal.  Left Ear: External ear normal.  Nose: Nose normal.  Mouth/Throat: Oropharynx is clear and moist. No oropharyngeal exudate.  Eyes: Conjunctivae and EOM are normal. Pupils are equal, round, and reactive to light. No scleral icterus.  Neck: Normal range of motion. Neck supple. No JVD present. No thyromegaly present.  Cardiovascular: Normal rate, regular rhythm, normal heart sounds and intact distal pulses.  Exam reveals no gallop and no friction rub.   No murmur heard. Pulmonary/Chest: Effort normal and breath sounds normal. No respiratory distress. She has no wheezes. She has no rales. She exhibits no tenderness.  Abdominal: Soft. Bowel sounds are normal. She exhibits no distension and no mass. There is no tenderness. There is no rebound and no guarding.  Musculoskeletal: Normal range of motion. She exhibits no edema or tenderness.  Lymphadenopathy:    She has no cervical adenopathy.  Neurological: She is alert and  oriented to person, place, and time. She has normal reflexes. No cranial nerve deficit. She exhibits normal muscle tone. Coordination normal.  Skin: Skin is warm and dry. No rash noted. No erythema.  Psychiatric: She has a normal mood and affect. Her behavior is normal. Judgment and thought content normal.          Assessment & Plan:  Well exam. She was cleared for all nursing duties. A PPD was placed. I discussed the importance of regular exercise to help reduce her weight.

## 2014-12-26 LAB — TB SKIN TEST
INDURATION: 0 mm
TB Skin Test: NEGATIVE

## 2015-03-12 ENCOUNTER — Encounter (HOSPITAL_COMMUNITY): Payer: Self-pay

## 2015-03-12 ENCOUNTER — Emergency Department (HOSPITAL_COMMUNITY)
Admission: EM | Admit: 2015-03-12 | Discharge: 2015-03-12 | Disposition: A | Discharge status: Home / Self Care | Payer: Managed Care, Other (non HMO) | Attending: Emergency Medicine | Admitting: Emergency Medicine

## 2015-03-12 ENCOUNTER — Emergency Department (HOSPITAL_COMMUNITY): Payer: Managed Care, Other (non HMO)

## 2015-03-12 DIAGNOSIS — Z79899 Other long term (current) drug therapy: Secondary | ICD-10-CM | POA: Insufficient documentation

## 2015-03-12 DIAGNOSIS — R109 Unspecified abdominal pain: Secondary | ICD-10-CM

## 2015-03-12 DIAGNOSIS — R1031 Right lower quadrant pain: Secondary | ICD-10-CM | POA: Diagnosis present

## 2015-03-12 DIAGNOSIS — J45909 Unspecified asthma, uncomplicated: Secondary | ICD-10-CM | POA: Diagnosis not present

## 2015-03-12 DIAGNOSIS — N201 Calculus of ureter: Secondary | ICD-10-CM | POA: Diagnosis not present

## 2015-03-12 DIAGNOSIS — Z8639 Personal history of other endocrine, nutritional and metabolic disease: Secondary | ICD-10-CM | POA: Insufficient documentation

## 2015-03-12 DIAGNOSIS — N200 Calculus of kidney: Secondary | ICD-10-CM | POA: Insufficient documentation

## 2015-03-12 LAB — URINALYSIS, ROUTINE W REFLEX MICROSCOPIC
Bilirubin Urine: NEGATIVE
Glucose, UA: NEGATIVE mg/dL
Ketones, ur: 40 mg/dL — AB
Leukocytes, UA: NEGATIVE
Nitrite: NEGATIVE
PROTEIN: NEGATIVE mg/dL
Specific Gravity, Urine: 1.02 (ref 1.005–1.030)
UROBILINOGEN UA: 0.2 mg/dL (ref 0.0–1.0)
pH: 6 (ref 5.0–8.0)

## 2015-03-12 LAB — BASIC METABOLIC PANEL
Anion gap: 9 (ref 5–15)
BUN: 9 mg/dL (ref 6–20)
CALCIUM: 9.4 mg/dL (ref 8.9–10.3)
CO2: 23 mmol/L (ref 22–32)
CREATININE: 0.9 mg/dL (ref 0.44–1.00)
Chloride: 107 mmol/L (ref 101–111)
GFR calc Af Amer: >60 mL/min (ref >60)
GFR calc non Af Amer: >60 mL/min (ref >60)
GLUCOSE: 106 mg/dL — AB (ref 65–99)
Potassium: 3.9 mmol/L (ref 3.5–5.1)
Sodium: 139 mmol/L (ref 135–145)

## 2015-03-12 LAB — CBC WITH DIFFERENTIAL/PLATELET
BASOS PCT: 0 %
Basophils Absolute: 0 10*3/uL (ref 0.0–0.1)
Eosinophils Absolute: 0 10*3/uL (ref 0.0–0.7)
Eosinophils Relative: 0 %
HEMATOCRIT: 38.6 % (ref 36.0–46.0)
Hemoglobin: 12.8 g/dL (ref 12.0–15.0)
LYMPHS ABS: 1.4 10*3/uL (ref 0.7–4.0)
LYMPHS PCT: 9 %
MCH: 28.8 pg (ref 26.0–34.0)
MCHC: 33.2 g/dL (ref 30.0–36.0)
MCV: 86.9 fL (ref 78.0–100.0)
MONOS PCT: 5 %
Monocytes Absolute: 0.8 10*3/uL (ref 0.1–1.0)
NEUTROS ABS: 13.4 10*3/uL — AB (ref 1.7–7.7)
Neutrophils Relative %: 86 %
Platelets: 319 10*3/uL (ref 150–400)
RBC: 4.44 MIL/uL (ref 3.87–5.11)
RDW: 12.5 % (ref 11.5–15.5)
WBC: 15.5 10*3/uL — ABNORMAL HIGH (ref 4.0–10.5)

## 2015-03-12 LAB — URINALYSIS W MICROSCOPIC (NOT AT ARMC)
Bilirubin Urine: NEGATIVE
Glucose, UA: NEGATIVE mg/dL
Ketones, ur: NEGATIVE mg/dL
Nitrite: NEGATIVE
Protein, ur: NEGATIVE mg/dL
Specific Gravity, Urine: 1.02 (ref 1.005–1.030)
Urobilinogen, UA: 0.2 mg/dL (ref 0.0–1.0)
pH: 6 (ref 5.0–8.0)

## 2015-03-12 LAB — URINE MICROSCOPIC-ADD ON

## 2015-03-12 LAB — I-STAT BETA HCG BLOOD, ED (MC, WL, AP ONLY)

## 2015-03-12 MED ORDER — MORPHINE SULFATE (PF) 4 MG/ML IV SOLN
4.0000 mg | Freq: Once | INTRAVENOUS | Status: AC
  Administered 2015-03-12: 4 mg via INTRAVENOUS
  Filled 2015-03-12: qty 1

## 2015-03-12 MED ORDER — ONDANSETRON HCL 4 MG PO TABS: 4.0000 mg | ORAL_TABLET | Freq: Three times a day (TID) | ORAL | Status: DC | PRN

## 2015-03-12 MED ORDER — TAMSULOSIN HCL 0.4 MG PO CAPS: 0.4000 mg | ORAL_CAPSULE | Freq: Every day | ORAL | Status: DC

## 2015-03-12 MED ORDER — OXYCODONE-ACETAMINOPHEN 5-325 MG PO TABS: 1.0000 | ORAL_TABLET | Freq: Four times a day (QID) | ORAL | Status: DC | PRN

## 2015-03-12 MED ORDER — ONDANSETRON HCL 4 MG/2ML IJ SOLN
4.0000 mg | Freq: Once | INTRAMUSCULAR | Status: AC
  Administered 2015-03-12: 4 mg via INTRAVENOUS
  Filled 2015-03-12: qty 2

## 2015-03-12 MED ORDER — SODIUM CHLORIDE 0.9 % IV BOLUS (SEPSIS)
1000.0000 mL | Freq: Once | INTRAVENOUS | Status: AC
  Administered 2015-03-12: 1000 mL via INTRAVENOUS

## 2015-03-12 MED ORDER — IBUPROFEN 600 MG PO TABS
600.0000 mg | ORAL_TABLET | Freq: Four times a day (QID) | ORAL | Status: DC | PRN
Start: 2015-03-12 — End: 2015-07-09

## 2015-03-12 MED ORDER — KETOROLAC TROMETHAMINE 30 MG/ML IJ SOLN
30.0000 mg | Freq: Once | INTRAMUSCULAR | Status: AC
  Administered 2015-03-12: 30 mg via INTRAVENOUS
  Filled 2015-03-12: qty 1

## 2015-03-12 NOTE — ED Notes (Signed)
Pt reports right sided flank pain since 0900 today. Reports hx of kidney stones, states this feels the same. Took half a Oxycodone with no relief. Denies hematuria. VSS.

## 2015-03-12 NOTE — ED Provider Notes (Signed)
CSN: [id number]     Arrival date & time 03/12/15  1403 History   First MD Initiated Contact with Patient 03/12/15 1533     Chief Complaint  Patient presents with  . Flank Pain     (Consider location/radiation/quality/duration/timing/severity/associated sxs/prior Treatment) HPI 19 year old female with history of nephrolithiasis and PCOS who presents with right flank pain. Similar to previous episode of kidney stones for which she was treated in 2015. Has followed up with urologist and diagnosed with calcium oxalate stones. No urological interventions in the past for stones. One week of urinary frequency without dysuria or hematuria. Sudden onset of severe sharp flank pain today on right side, now radiating into the right abdomen and into the groin. No fevers, chills, diarrhea, vaginal bleeding or discharge.  Past Medical History  Diagnosis Date  . Asthma   . Kidney stone   . PCOS (polycystic ovarian syndrome)     sees Dr. Louretta Shorten    Past Surgical History  Procedure Laterality Date  . Abdominal hysterectomy     Family History  Problem Relation Age of Onset  . Diabetes    . Anxiety disorder    . Hypertension Mother   . Diabetes Father   . Hypertension Father    Social History  Substance Use Topics  . Smoking status: Never Smoker   . Smokeless tobacco: Never Used  . Alcohol Use: No   OB History    No data available     Review of Systems 10/14 systems reviewed and are negative other than those stated in the HPI    Allergies  Review of patient's allergies indicates no known allergies.  Home Medications   Prior to Admission medications   Medication Sig Start Date End Date Taking? Authorizing Provider  Cyanocobalamin (VITAMIN B-12 PO) Take 2 tablets by mouth daily.    Yes Historical Provider, MD  ibuprofen (ADVIL,MOTRIN) 200 MG tablet Take 400-600 mg by mouth every 6 (six) hours as needed for headache.   Yes Historical Provider, MD  norgestimate-ethinyl estradiol  (PREVIFEM) 0.25-35 MG-MCG tablet Take 1 tablet by mouth daily.   Yes Historical Provider, MD  silodosin (RAPAFLO) 4 MG CAPS capsule Take 4 mg by mouth daily with breakfast.   Yes Historical Provider, MD  ibuprofen (ADVIL,MOTRIN) 600 MG tablet Take 1 tablet (600 mg total) by mouth every 6 (six) hours as needed for mild pain or moderate pain. 03/12/15   Forde Dandy, MD  ondansetron (ZOFRAN) 4 MG tablet Take 1 tablet (4 mg total) by mouth every 8 (eight) hours as needed for nausea or vomiting. 03/12/15   Forde Dandy, MD  oxyCODONE-acetaminophen (PERCOCET/ROXICET) 5-325 MG tablet Take 1-2 tablets by mouth every 6 (six) hours as needed for moderate pain or severe pain. 03/12/15   Forde Dandy, MD  tamsulosin (FLOMAX) 0.4 MG CAPS capsule Take 1 capsule (0.4 mg total) by mouth daily. 03/12/15   Forde Dandy, MD   BP 131/65 mmHg  Pulse 69  Temp(Src) 97.9 F (36.6 C) (Oral)  Resp 18  SpO2 97%  LMP 02/26/2015 Physical Exam Physical Exam  Nursing note and vitals reviewed. Constitutional: Well developed, well nourished, non-toxic, and in no acute distress Head: Normocephalic and atraumatic.  Mouth/Throat: Oropharynx is clear and moist.  Neck: Normal range of motion. Neck supple.  Cardiovascular: Normal rate and regular rhythm.   Pulmonary/Chest: Effort normal and breath sounds normal.  Abdominal: Soft. There is no tenderness at Mcburney's pont or in the pelvis.  Negative murphy's sign. There is no rebound and no guarding. Right CVA tenderness. Musculoskeletal: Normal range of motion.  Neurological: Alert, no facial droop, fluent speech, moves all extremities symmetrically Skin: Skin is warm and dry.  Psychiatric: Cooperative  ED Course  Procedures (including critical care time) Labs Review Labs Reviewed  CBC WITH DIFFERENTIAL/PLATELET - Abnormal; Notable for the following:    WBC 15.5 (*)    Neutro Abs 13.4 (*)    All other components within normal limits  BASIC METABOLIC PANEL - Abnormal;  Notable for the following:    Glucose, Bld 106 (*)    All other components within normal limits  URINALYSIS W MICROSCOPIC - Abnormal; Notable for the following:    APPearance CLOUDY (*)    Hgb urine dipstick MODERATE (*)    Leukocytes, UA SMALL (*)    Bacteria, UA MANY (*)    Squamous Epithelial / LPF MANY (*)    All other components within normal limits  URINALYSIS, ROUTINE W REFLEX MICROSCOPIC (NOT AT [facility]) - Abnormal; Notable for the following:    Hgb urine dipstick LARGE (*)    Ketones, ur 40 (*)    All other components within normal limits  URINE MICROSCOPIC-ADD ON  I-STAT BETA HCG BLOOD, ED ([facility], [facility], [facility] ONLY)    Imaging Review Dg Abd 1 View  03/12/2015  CLINICAL DATA:  RIGHT flank pain starting this morning, history kidney stones, polycystic ovarian syndrome EXAM: ABDOMEN - 1 VIEW COMPARISON:  12/04/2013 FINDINGS: Questionable RIGHT pelvic calcification 9 x 4 mm in size versus artifact, not seen previously. No additional potential urinary tract calcifications. Bowel gas pattern normal. Osseous structures unremarkable. IMPRESSION: Questionable new 9 x 4 mm RIGHT pelvic calcification, unable to exclude distal RIGHT ureteral calculus with this appearance; recommend correlation with urinalysis. Electronically Signed   By: Lavonia Fawzi Melman M.D.   On: 03/12/2015 18:00   US Renal  03/12/2015  CLINICAL DATA:  right flank pain since 9 a.m. History of nephrolithiasis. EXAM: RENAL / URINARY TRACT ULTRASOUND COMPLETE COMPARISON:  Radiograph from today, and earlier studies FINDINGS: Right Kidney: Length: 14.2 cm . The parenchyma is Isoechoic to the adjacent liver. There is moderate hydronephrosis. No focal cortical lesion is identified. Left Kidney: Length: 13.9 cm. Echogenicity within normal limits. No mass or hydronephrosis visualized. Bladder: Appears normal for degree of bladder distention. IMPRESSION: 1. Moderate right hydronephrosis. Electronically Signed   By: Lucrezia Europe M.D.   On: 03/12/2015  20:14   I have personally reviewed and evaluated these images and lab results as part of my medical decision-making.   MDM   Final diagnoses:  Calculus of ureter  Right flank pain  Kidney stone    19 year old female with history of nephrolithiasis of PCOS who presents with right flank pain and urinary urgency. Eyes nontoxic and in no acute distress on presentation. Vital signs are not concerning. She has a soft and nontender abdomen, and without pelvic tenderness either. Significant right CVA tenderness is noted. Urine shows evidence of blood without evidence of concurrent infection. Ultrasound reveals moderate hydronephrosis on the right side consistent with urolithiasis. Blood work is otherwise unremarkable and shows normal renal function. Patient does have a urologist that she can follow-up with. Pain has been well controlled with Toradol and morphine here in the emergency department. She is discharged home with a prescription for analgesics, antibiotics, and Flomax. She will follow up closely with her urologist for ongoing management.  Forde Dandy, MD 03/12/15 [phone]

## 2015-03-12 NOTE — Discharge Instructions (Signed)
Please call your urologist tomorrow to set up follow-up appointment regarding further management of your kidney stones. Return without fail for worsening symptoms including worsening pain, fever, vomiting and unable to keep down food or fluids, or any other symptoms concerning to you.  Kidney Stones Kidney stones (urolithiasis) are solid masses that form inside your kidneys. The intense pain is caused by the stone moving through the kidney, ureter, bladder, and urethra (urinary tract). When the stone moves, the ureter starts to spasm around the stone. The stone is usually passed in your pee (urine).  HOME CARE  Drink enough fluids to keep your pee clear or pale yellow. This helps to get the stone out.  Take a 24-hour pee (urine) sample as told by your doctor. You may need to take another sample every 6-12 months.  Strain all pee through the provided strainer. Do not pee without peeing through the strainer, not even once. If you pee the stone out, catch it in the strainer. The stone may be as small as a grain of salt. Take this to your doctor. This will help your doctor figure out what you can do to try to prevent more kidney stones.  Only take medicine as told by your doctor.  Make changes to your daily diet as told by your doctor. You may be told to:  Limit how much salt you eat.  Eat 5 or more servings of fruits and vegetables each day.  Limit how much meat, poultry, fish, and eggs you eat.  Keep all follow-up visits as told by your doctor. This is important.  Get follow-up X-rays as told by your doctor. GET HELP IF: You have pain that gets worse even if you have been taking pain medicine. GET HELP RIGHT AWAY IF:   Your pain does not get better with medicine.  You have a fever or shaking chills.  Your pain increases and gets worse over 18 hours.  You have new belly (abdominal) pain.  You feel faint or pass out.  You are unable to pee.   This information is not intended to  replace advice given to you by your health care provider. Make sure you discuss any questions you have with your health care provider.   Document Released: 10/28/2007 Document Revised: 01/30/2015 Document Reviewed: 10/12/2012 Elsevier Interactive Patient Education [employer]2016 Elsevier Inc.

## 2015-07-09 ENCOUNTER — Ambulatory Visit (INDEPENDENT_AMBULATORY_CARE_PROVIDER_SITE_OTHER): Payer: Managed Care, Other (non HMO) | Admitting: Family Medicine

## 2015-07-09 ENCOUNTER — Encounter: Payer: Self-pay | Admitting: Family Medicine

## 2015-07-09 VITALS — BP 135/84 | HR 83 | Temp 99.2°F | Ht 68.0 in | Wt 301.0 lb

## 2015-07-09 DIAGNOSIS — M7711 Lateral epicondylitis, right elbow: Secondary | ICD-10-CM

## 2015-07-09 MED ORDER — DICLOFENAC SODIUM 75 MG PO TBEC: 75.0000 mg | DELAYED_RELEASE_TABLET | Freq: Two times a day (BID) | ORAL | Status: DC

## 2015-07-09 NOTE — Progress Notes (Signed)
Pre visit review using our clinic review tool, if applicable. No additional management support is needed unless otherwise documented below in the visit note. 

## 2015-07-09 NOTE — Progress Notes (Signed)
   Subjective:    Patient ID: Kristi Ewing, female    DOB: 04-13-1996, 20 y.o.   MRN: [id number]  HPI Here for one week of pain in the right elbow and forearm, and some numbness in the right thumb. No swelling. No recent trauma. Ibuprofen has not helped.    Review of Systems  Constitutional: Negative.   Musculoskeletal: Positive for arthralgias. Negative for joint swelling, neck pain and neck stiffness.       Objective:   Physical Exam  Constitutional: She appears well-developed. No distress.  Musculoskeletal:  Tender over the right lateral epicondyle. No swelling. The elbow has full ROM.           Assessment & Plan:  Tennis elbow. Rest, ice. Use Diclofenac bid. Recheck prn

## 2015-10-16 ENCOUNTER — Ambulatory Visit (INDEPENDENT_AMBULATORY_CARE_PROVIDER_SITE_OTHER): Payer: 59 | Admitting: Family Medicine

## 2015-10-16 ENCOUNTER — Encounter: Payer: Self-pay | Admitting: Family Medicine

## 2015-10-16 VITALS — BP 120/88 | HR 106 | Temp 98.6°F | Ht 67.0 in | Wt 307.9 lb

## 2015-10-16 DIAGNOSIS — H65192 Other acute nonsuppurative otitis media, left ear: Secondary | ICD-10-CM | POA: Diagnosis not present

## 2015-10-16 DIAGNOSIS — J019 Acute sinusitis, unspecified: Secondary | ICD-10-CM | POA: Diagnosis not present

## 2015-10-16 MED ORDER — METHYLPREDNISOLONE ACETATE 80 MG/ML IJ SUSP
120.0000 mg | Freq: Once | INTRAMUSCULAR | Status: AC
  Administered 2015-10-16: 120 mg via INTRAMUSCULAR

## 2015-10-16 MED ORDER — AZITHROMYCIN 250 MG PO TABS: ORAL_TABLET | ORAL | Status: DC

## 2015-10-16 NOTE — Progress Notes (Signed)
   Subjective:    Patient ID: Kristi Ewing, female    DOB: Oct 11, 1995, 20 y.o.   MRN: [id number]  HPI Here for 2 days of sinus pressure, PND, a dry cough, and left ear pain. On Dayquil.    Review of Systems  Constitutional: Negative.   HENT: Positive for congestion, ear pain, postnasal drip and sinus pressure. Negative for sore throat.   Eyes: Negative.   Respiratory: Positive for cough.        Objective:   Physical Exam  Constitutional: She appears well-developed and well-nourished.  HENT:  Right Ear: External ear normal.  Nose: Nose normal.  Mouth/Throat: Oropharynx is clear and moist.  Left TM is red and dull   Eyes: Conjunctivae are normal.  Neck: No thyromegaly present.  Pulmonary/Chest: Effort normal and breath sounds normal.  Lymphadenopathy:    She has no cervical adenopathy.          Assessment & Plan:  Sinusitis and left otitis media, treat with Augmentin and a steroid shot.  [name], MD

## 2015-10-16 NOTE — Addendum Note (Signed)
Addended by: Aniceto BossNIMMONS, Amelia Burgard A on: 10/16/2015 03:20 PM   Modules accepted: Orders, SmartSet

## 2016-08-05 IMAGING — US US RENAL
1 series · 14 of 25 positions shown · non-contrast
Comparison: Radiograph from today, and earlier studies

CLINICAL DATA: right flank pain since 9 a.m.. History of
nephrolithiasis..

EXAM:
RENAL / URINARY TRACT ULTRASOUND COMPLETE

[Series 1: us renal · 0.26mm/px · 14 of 37 slices shown]
[im 1/37]
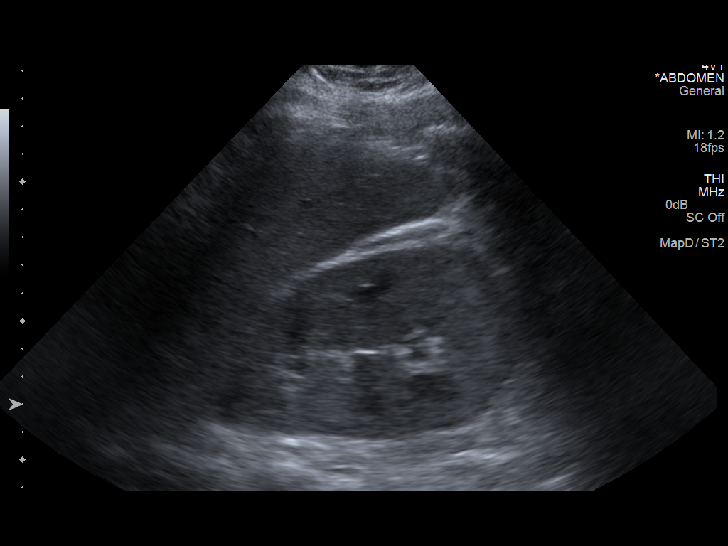
[im 4/37]
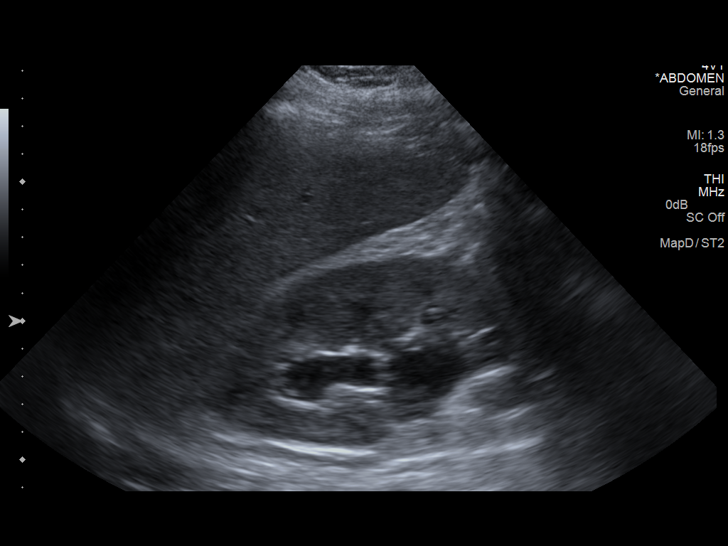
[im 7/37]
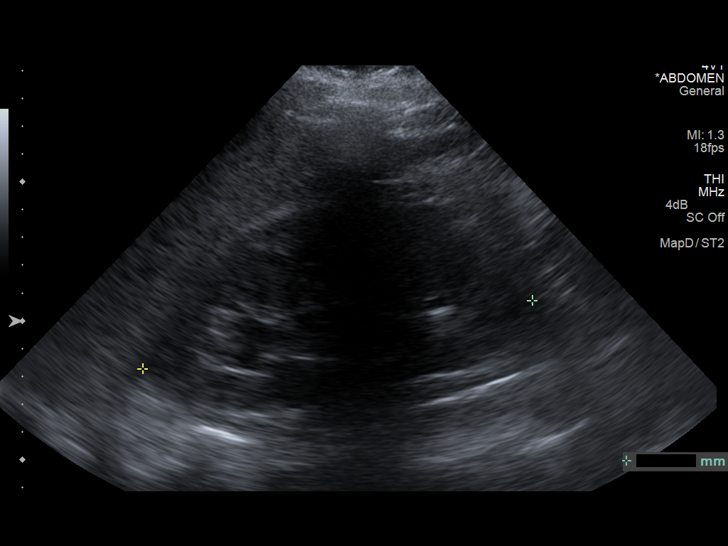
[im 10/37]
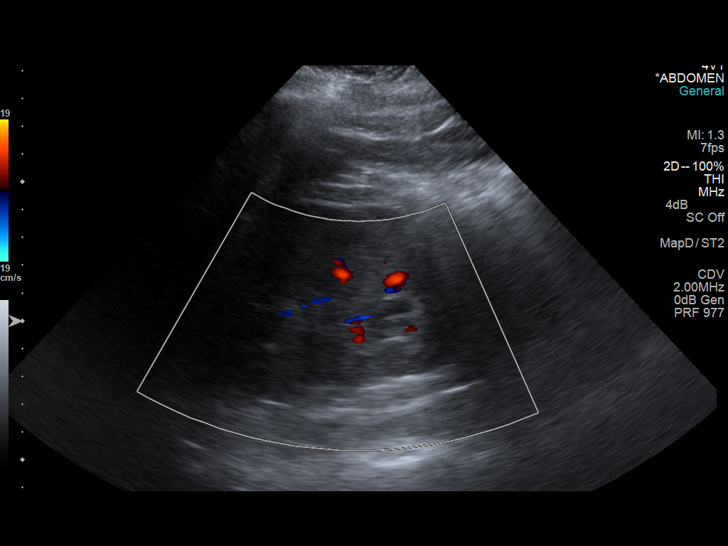
[im 13/37]
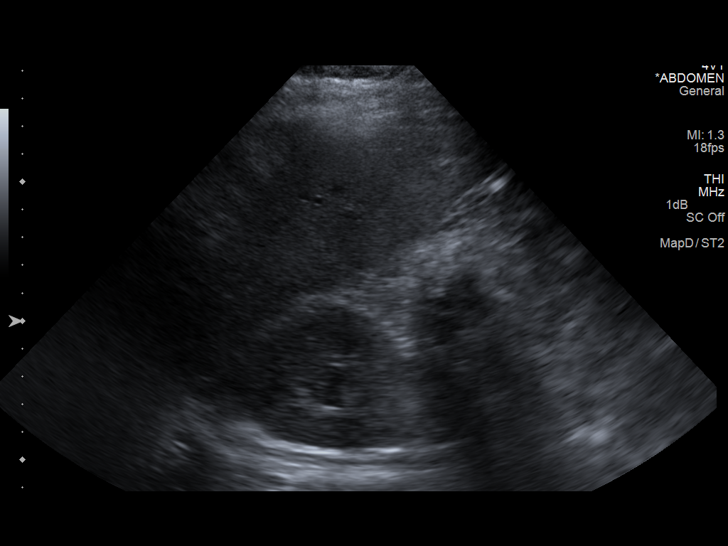
[im 14/37]
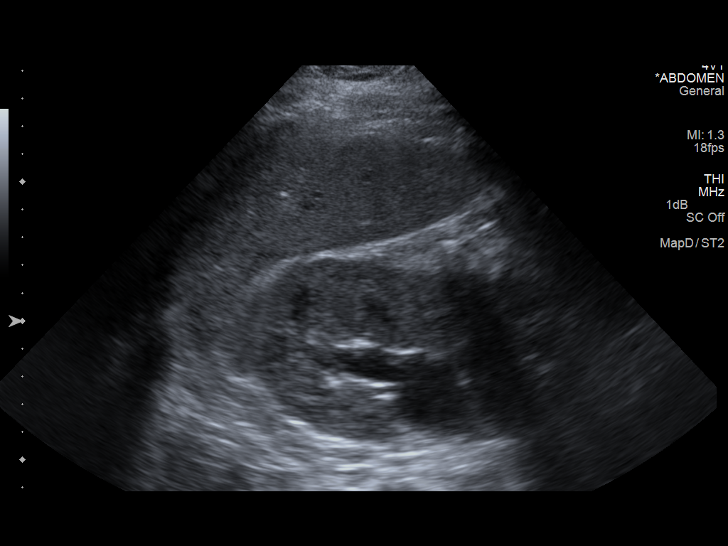
[im 17/37]
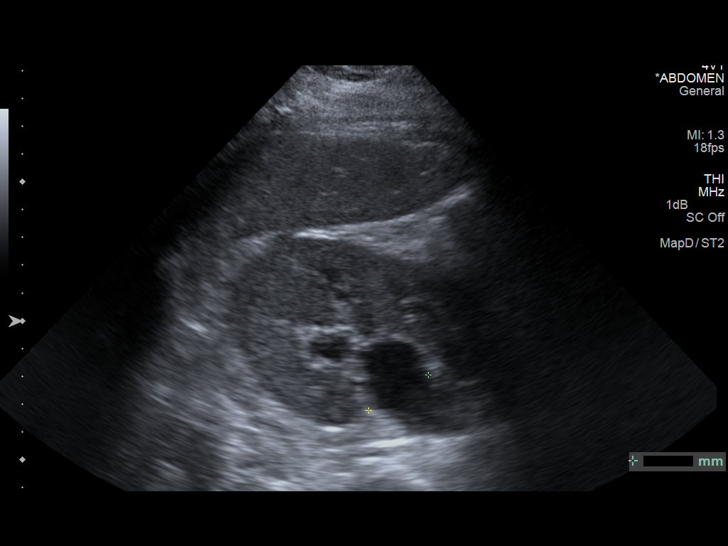
[im 20/37]
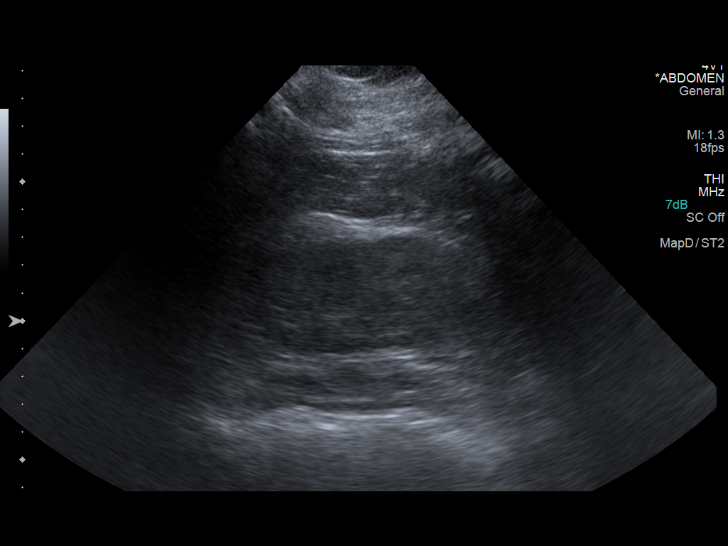
[im 23/37]
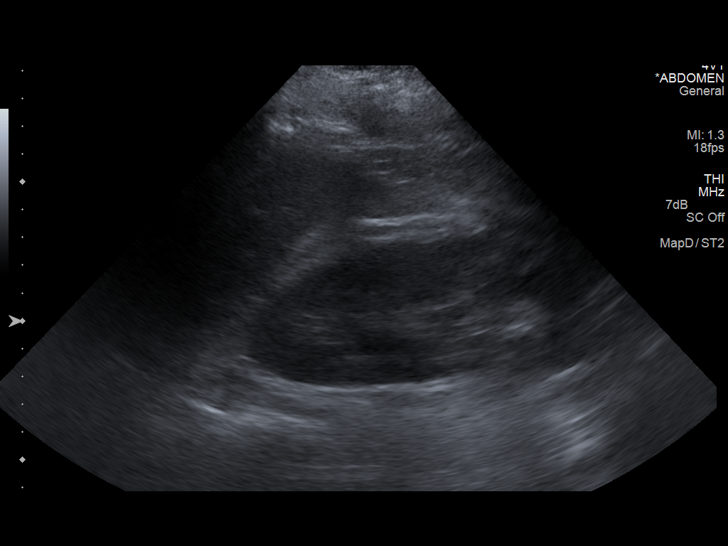
[im 25/37]
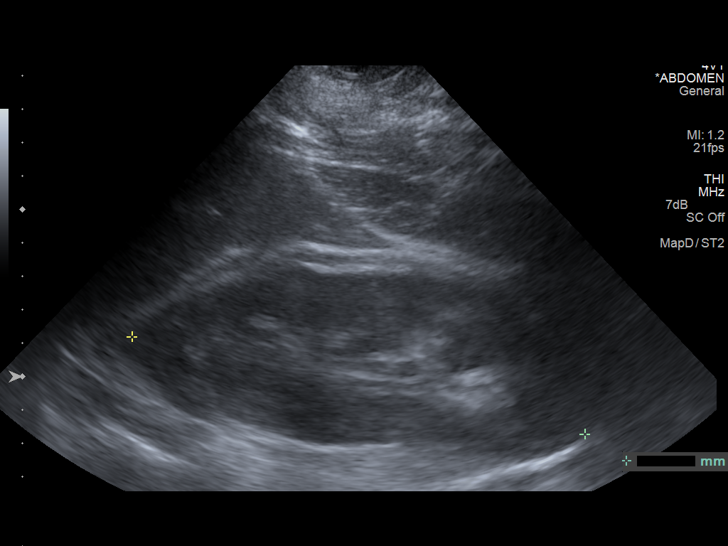
[im 28/37]
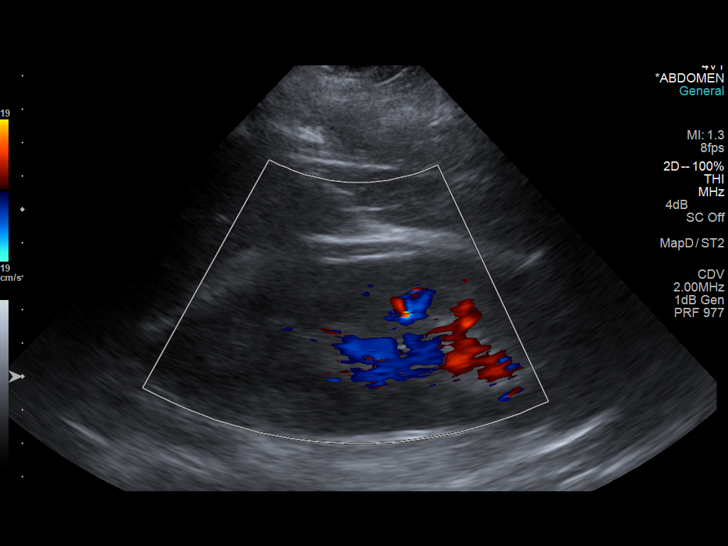
[im 31/37]
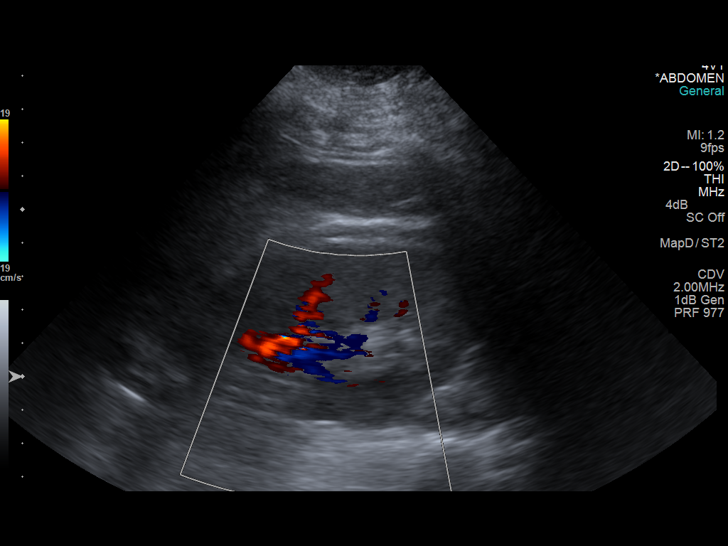
[im 34/37]
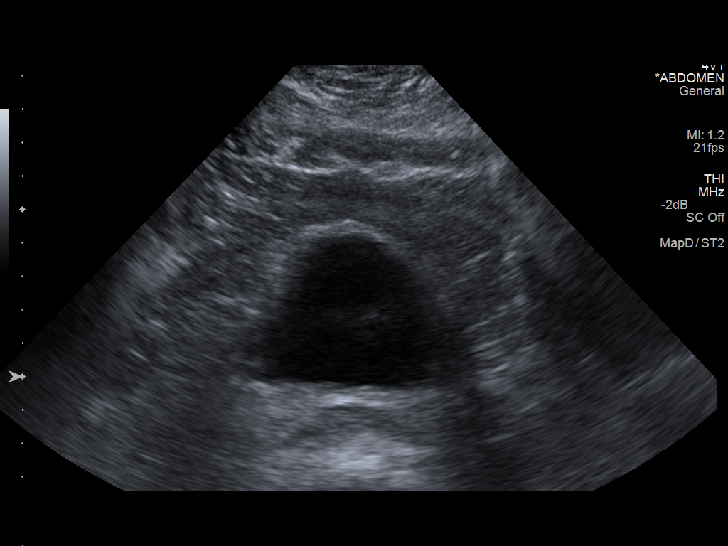
[im 37/37]
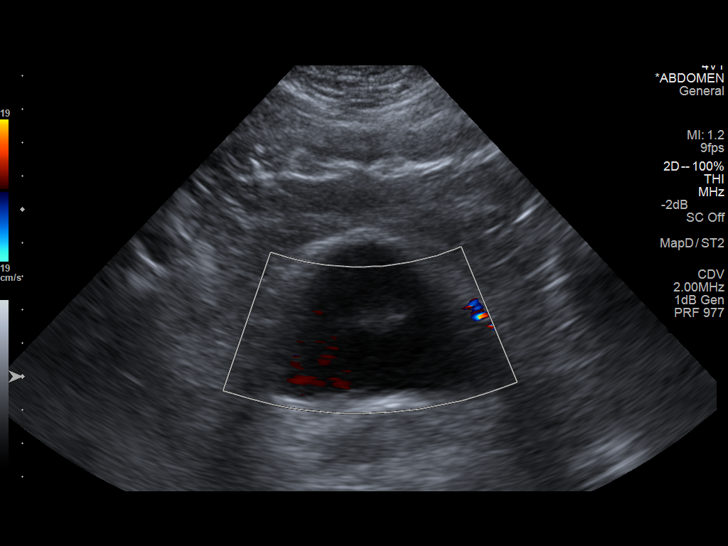

[14 of 25 positions shown; findings below may reference images not displayed]

FINDINGS: Right Kidney:

Length: 14.2 cm . The parenchyma is Isoechoic to the adjacent liver.
There is moderate hydronephrosis. No focal cortical lesion is
identified.

Left Kidney:

Length: 13.9 cm. Echogenicity within normal limits. No mass or
hydronephrosis visualized.

Bladder:

Appears normal for degree of bladder distention.
IMPRESSION: 1. Moderate right hydronephrosis.

## 2016-10-03 ENCOUNTER — Emergency Department (HOSPITAL_COMMUNITY)
Admission: EM | Admit: 2016-10-03 | Discharge: 2016-10-04 | Disposition: A | Discharge status: Home / Self Care | Payer: 59 | Attending: Emergency Medicine | Admitting: Emergency Medicine

## 2016-10-03 ENCOUNTER — Encounter (HOSPITAL_COMMUNITY): Payer: Self-pay | Admitting: Emergency Medicine

## 2016-10-03 DIAGNOSIS — Z79899 Other long term (current) drug therapy: Secondary | ICD-10-CM | POA: Diagnosis not present

## 2016-10-03 DIAGNOSIS — K8020 Calculus of gallbladder without cholecystitis without obstruction: Secondary | ICD-10-CM | POA: Insufficient documentation

## 2016-10-03 DIAGNOSIS — N23 Unspecified renal colic: Secondary | ICD-10-CM | POA: Insufficient documentation

## 2016-10-03 DIAGNOSIS — R109 Unspecified abdominal pain: Secondary | ICD-10-CM | POA: Diagnosis present

## 2016-10-03 DIAGNOSIS — J45909 Unspecified asthma, uncomplicated: Secondary | ICD-10-CM | POA: Diagnosis not present

## 2016-10-03 HISTORY — DX: Unspecified asthma, uncomplicated: J45.909

## 2016-10-03 MED ORDER — KETOROLAC TROMETHAMINE 30 MG/ML IJ SOLN
30.0000 mg | Freq: Once | INTRAMUSCULAR | Status: AC
  Administered 2016-10-04: 30 mg via INTRAVENOUS
  Filled 2016-10-03: qty 1

## 2016-10-03 MED ORDER — ONDANSETRON HCL 4 MG/2ML IJ SOLN
4.0000 mg | Freq: Once | INTRAMUSCULAR | Status: AC
  Administered 2016-10-04: 4 mg via INTRAVENOUS
  Filled 2016-10-03: qty 2

## 2016-10-03 MED ORDER — MORPHINE SULFATE (PF) 4 MG/ML IV SOLN
4.0000 mg | Freq: Once | INTRAVENOUS | Status: AC | PRN
  Administered 2016-10-04: 4 mg via INTRAVENOUS
  Filled 2016-10-03: qty 1

## 2016-10-03 MED ORDER — SODIUM CHLORIDE 0.9 % IV BOLUS (SEPSIS)
1000.0000 mL | Freq: Once | INTRAVENOUS | Status: AC
  Administered 2016-10-04: 1000 mL via INTRAVENOUS

## 2016-10-03 NOTE — ED Notes (Addendum)
Pt c/o 8/10 left flank pain radiating towards the front and back onset 2 hours ago. Hx of kidney stones 2 years ago, she stated that this pain feels similar.

## 2016-10-03 NOTE — ED Triage Notes (Signed)
Pt states two hours ago she had sudden onset left flank pain. Hx of kidney stones, states it feels the same. States she is having urinary frequency but is unable to urinate more than a little bit each time.

## 2016-10-04 ENCOUNTER — Emergency Department (HOSPITAL_COMMUNITY): Payer: 59

## 2016-10-04 LAB — URINALYSIS, ROUTINE W REFLEX MICROSCOPIC
BILIRUBIN URINE: NEGATIVE
Bacteria, UA: NONE SEEN
GLUCOSE, UA: NEGATIVE mg/dL
KETONES UR: NEGATIVE mg/dL
Nitrite: NEGATIVE
PH: 5 (ref 5.0–8.0)
Protein, ur: 30 mg/dL — AB
Specific Gravity, Urine: 1.026 (ref 1.005–1.030)

## 2016-10-04 LAB — CBC WITH DIFFERENTIAL/PLATELET
Basophils Absolute: 0 10*3/uL (ref 0.0–0.1)
Basophils Relative: 0 %
EOS ABS: 0.1 10*3/uL (ref 0.0–0.7)
Eosinophils Relative: 1 %
HCT: 38.5 % (ref 36.0–46.0)
HEMOGLOBIN: 12.6 g/dL (ref 12.0–15.0)
LYMPHS ABS: 3.8 10*3/uL (ref 0.7–4.0)
LYMPHS PCT: 41 %
MCH: 28.6 pg (ref 26.0–34.0)
MCHC: 32.7 g/dL (ref 30.0–36.0)
MCV: 87.3 fL (ref 78.0–100.0)
Monocytes Absolute: 0.7 10*3/uL (ref 0.1–1.0)
Monocytes Relative: 8 %
NEUTROS ABS: 4.6 10*3/uL (ref 1.7–7.7)
Neutrophils Relative %: 50 %
Platelets: 323 10*3/uL (ref 150–400)
RBC: 4.41 MIL/uL (ref 3.87–5.11)
RDW: 12.8 % (ref 11.5–15.5)
WBC: 9.2 10*3/uL (ref 4.0–10.5)

## 2016-10-04 LAB — COMPREHENSIVE METABOLIC PANEL
ALBUMIN: 4.4 g/dL (ref 3.5–5.0)
ALK PHOS: 54 U/L (ref 38–126)
ALT: 49 U/L (ref 14–54)
ANION GAP: 11 (ref 5–15)
AST: 28 U/L (ref 15–41)
BUN: 10 mg/dL (ref 6–20)
CALCIUM: 10 mg/dL (ref 8.9–10.3)
CO2: 21 mmol/L — AB (ref 22–32)
Chloride: 106 mmol/L (ref 101–111)
Creatinine, Ser: 0.87 mg/dL (ref 0.44–1.00)
GFR calc Af Amer: >60 mL/min (ref >60)
GFR calc non Af Amer: >60 mL/min (ref >60)
GLUCOSE: 116 mg/dL — AB (ref 65–99)
POTASSIUM: 3.8 mmol/L (ref 3.5–5.1)
SODIUM: 138 mmol/L (ref 135–145)
Total Bilirubin: 0.5 mg/dL (ref 0.3–1.2)
Total Protein: 7.4 g/dL (ref 6.5–8.1)

## 2016-10-04 MED ORDER — ONDANSETRON 4 MG PO TBDP: 4.0000 mg | ORAL_TABLET | Freq: Three times a day (TID) | ORAL | 0 refills | Status: DC | PRN

## 2016-10-04 MED ORDER — OXYCODONE-ACETAMINOPHEN 5-325 MG PO TABS
1.0000 | ORAL_TABLET | Freq: Once | ORAL | Status: AC
  Administered 2016-10-04: 1 via ORAL
  Filled 2016-10-04: qty 1

## 2016-10-04 MED ORDER — OXYCODONE-ACETAMINOPHEN 5-325 MG PO TABS: 1.0000 | ORAL_TABLET | ORAL | 0 refills | Status: DC | PRN

## 2016-10-04 NOTE — ED Provider Notes (Signed)
H/O kidney stones Has left flank pain - pending US  Labs normal UA with Hgb, crystals  Plan: reassess for pain and nausea control after US  US shows renal stone, no hydronephrosis. No visualized ureteral stone. Will treat presumptively based on history, UA, persistent pain. Refer to urology prn   Elpidio AnisUpstill, Amiley Shishido, PA-C 10/04/16 0335    Tegeler, Canary Brimhristopher J, MD 10/04/16 1130

## 2016-10-04 NOTE — ED Provider Notes (Signed)
[facility]-EMERGENCY DEPT Provider Note   CSN: [id number] Arrival date & time: 10/03/16  2222     History   Chief Complaint Chief Complaint  Patient presents with  . Flank Pain    Left  . Nausea    HPI Kristi Ewing is a 21 y.o. female.  The history is provided by the patient and medical records. No language interpreter was used.  Flank Pain  Associated symptoms include abdominal pain.   Kristi Ewing is a 21 y.o. female  with a PMH of prior kidney stones who presents to the Emergency Department complaining of acute onset of sharp left flank pain which began approximately 3 hours ago. Associated symptoms include urinary hesitancy, nausea and 3 episodes of emesis. Sxs feel similar to prior kidney stones. She took Tylenol prior to arrival with no improvement. No alleviating or aggravating factors noted. No dysuria, fever, chills.  Past Medical History:  Diagnosis Date  . Asthma    childhood  . Kidney stone   . PCOS (polycystic ovarian syndrome)    sees Dr. Candice Campavid Lowe     Patient Active Problem List   Diagnosis Date Noted  . PCOS (polycystic ovarian syndrome) 12/24/2014  . Pyrexia 08/24/2014  . ANXIETY STATE, UNSPECIFIED 10/15/2009    History reviewed. No pertinent surgical history.  OB History    No data available       Home Medications    Prior to Admission medications   Medication Sig Start Date End Date Taking? Authorizing Provider  acetaminophen (TYLENOL) 325 MG tablet Take 650 mg by mouth every 6 (six) hours as needed for mild pain or moderate pain.   Yes [provider]  norgestimate-ethinyl estradiol (PREVIFEM) 0.25-35 MG-MCG tablet Take 1 tablet by mouth daily.   Yes [provider]    Family History Family History  Problem Relation Age of Onset  . Diabetes Unknown   . Anxiety disorder Unknown   . Hypertension Mother   . Diabetes Father   . Hypertension Father     Social History Social History  Substance Use Topics    . Smoking status: Never Smoker  . Smokeless tobacco: Never Used  . Alcohol use No     Allergies   Patient has no known allergies.   Review of Systems Review of Systems  Gastrointestinal: Positive for abdominal pain, nausea and vomiting. Negative for blood in stool, constipation and diarrhea.  Genitourinary: Positive for difficulty urinating and flank pain. Negative for dysuria and vaginal discharge.  All other systems reviewed and are negative.    Physical Exam Updated Vital Signs BP 139/87 (BP Location: Left Arm)   Pulse 83   Temp 98.5 F (36.9 C) (Oral)   Resp 18   Ht 5\' 7"  (1.702 m)   Wt 124.7 kg   LMP 09/09/2016 (Approximate)   SpO2 98%   BMI 43.07 kg/m   Physical Exam  Constitutional: She is oriented to person, place, and time. She appears well-developed and well-nourished. No distress.  HENT:  Head: Normocephalic and atraumatic.  Neck: Neck supple.  Cardiovascular: Normal rate, regular rhythm and normal heart sounds.   No murmur heard. Pulmonary/Chest: Effort normal and breath sounds normal. No respiratory distress.  Abdominal: Soft. Bowel sounds are normal. She exhibits no distension.  Tenderness to palpation along left low back, flank and lower quadrant.  Neurological: She is alert and oriented to person, place, and time.  Skin: Skin is warm and dry.  Nursing note and vitals reviewed.  ED Treatments / Results  Labs (all labs ordered are listed, but only abnormal results are displayed) Labs Reviewed  COMPREHENSIVE METABOLIC PANEL - Abnormal; Notable for the following:       Result Value   CO2 21 (*)    Glucose, Bld 116 (*)    All other components within normal limits  URINALYSIS, ROUTINE W REFLEX MICROSCOPIC - Abnormal; Notable for the following:    APPearance HAZY (*)    Hgb urine dipstick LARGE (*)    Protein, ur 30 (*)    Leukocytes, UA TRACE (*)    Squamous Epithelial / LPF 0-5 (*)    All other components within normal limits  URINE  CULTURE  CBC WITH DIFFERENTIAL/PLATELET    EKG  EKG Interpretation None       Radiology No results found.  Procedures Procedures (including critical care time)  Medications Ordered in ED Medications  sodium chloride 0.9 % bolus 1,000 mL (1,000 mLs Intravenous New Bag/Given 10/04/16 0020)  ondansetron (ZOFRAN) injection 4 mg (4 mg Intravenous Given 10/04/16 0020)  ketorolac (TORADOL) 30 MG/ML injection 30 mg (30 mg Intravenous Given 10/04/16 0020)  morphine 4 MG/ML injection 4 mg (4 mg Intravenous Given 10/04/16 0105)     Initial Impression / Assessment and Plan / ED Course  I have reviewed the triage vital signs and the nursing notes.  Pertinent labs & imaging results that were available during my care of the patient were reviewed by me and considered in my medical decision making (see chart for details).    Kristi Ewing is a 21 y.o. female who presents to ED for left flank pain c/w prior kidney stones which began acutely approximately 3 hours ago. On arrival, patient is afebrile, hemodynamically stable with tenderness to left flank and lower quadrant. Will obtain labs, urine and renal ultrasound. Pain and nausea meds given. 1 L IV fluids ordered.   Labs reviewed and reassuring. UA with large amount of blood and calcium oxalate crystals. Renal ultrasound pending at shift change.  Care assumed by oncoming provider PA Upstill. Case discussed, plan agreed upon. Will follow up on renal ultrasound, ensure pain and nausea is controlled, PO challenge and dispo appropriately.     Final Clinical Impressions(s) / ED Diagnoses   Final diagnoses:  None    New Prescriptions New Prescriptions   No medications on file     Valaree Fresquez, Chase Picket, PA-C 10/04/16 1610    Tegeler, Canary Brim, MD 10/04/16 [phone]

## 2016-10-07 LAB — URINE CULTURE

## 2016-10-08 ENCOUNTER — Telehealth: Payer: Self-pay | Admitting: *Deleted

## 2016-10-08 NOTE — Telephone Encounter (Signed)
Post ED Visit - Positive Culture Follow-up  Culture report reviewed by antimicrobial stewardship pharmacist:  []  Kristi Ewing, Pharm.D. []  Kristi Ewing, Pharm.D., BCPS AQ-ID []  Kristi Ewing, Pharm.D., BCPS []  Kristi Ewing, Pharm.D., BCPS []  DilleyMinh Ewing, [address] BoulevardPharm.D., BCPS, AAHIVP []  Kristi Ewing, Pharm.D., BCPS, AAHIVP []  Kristi Ewing, PharmD, BCPS []  Kristi Ewing, PharmD, BCPS []  Kristi Ewing, PharmD, BCPS  Kristi ReamAlexandra Law, PA-C  Positive  Urine culture US with renal stones.  Recommended follow up with urology and no further patient follow-up is required at this time.  Virl AxeRobertson, Anzal Bartnick [facility] 10/08/2016, 11:32 AM

## 2016-12-03 ENCOUNTER — Encounter: Payer: Self-pay | Admitting: Family Medicine

## 2016-12-03 ENCOUNTER — Ambulatory Visit (INDEPENDENT_AMBULATORY_CARE_PROVIDER_SITE_OTHER): Payer: 59 | Admitting: Family Medicine

## 2016-12-03 VITALS — BP 128/72 | HR 106 | Temp 98.5°F | Wt 295.6 lb

## 2016-12-03 DIAGNOSIS — R059 Cough, unspecified: Secondary | ICD-10-CM

## 2016-12-03 DIAGNOSIS — R05 Cough: Secondary | ICD-10-CM

## 2016-12-03 DIAGNOSIS — J0100 Acute maxillary sinusitis, unspecified: Secondary | ICD-10-CM | POA: Diagnosis not present

## 2016-12-03 MED ORDER — AMOXICILLIN-POT CLAVULANATE 875-125 MG PO TABS: 1.0000 | ORAL_TABLET | Freq: Two times a day (BID) | ORAL | 0 refills | Status: DC

## 2016-12-03 MED ORDER — PROMETHAZINE-DM 6.25-15 MG/5ML PO SYRP: 5.0000 mL | ORAL_SOLUTION | Freq: Four times a day (QID) | ORAL | 0 refills | Status: DC | PRN

## 2016-12-03 NOTE — Progress Notes (Signed)
Patient ID: Kristi Ewing Squyres, female   DOB: Jun 13, 1995, 21 y.o.   MRN: [id number]  PCP: Nelwyn SalisburyFry, Stephen A, MD  Subjective:  Kristi Ewing Garry is a 21 y.o. 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 acetaminophen (TYLENOL) 325 MG tablet Take 650 mg by mouth every 6 (six) hours as needed for mild pain or moderate pain.    . norgestimate-ethinyl estradiol (PREVIFEM) 0.25-35 MG-MCG tablet Take 1 tablet by mouth daily.     No current facility-administered medications for this visit.     Objective: BP 128/72 (BP Location: Left Arm, Patient Position: Sitting, Cuff Size: Large)   Pulse (!) 106   Temp 98.5 F (36.9 C) (Oral)   Wt 295 lb 9.6 oz (134.1 kg)   SpO2 97%   BMI 46.30 kg/m  Gen: NAD, resting comfortably HEENT: Turbinates erythematous, Right TM normal, Left TM with mild erythema, pharynx mildly erythematous with no tonsilar exudate or edema, + maxillary sinus tenderness CV: RRR no murmurs rubs or gallops Lungs: CTAB no crackles, wheeze, rhonchi Abdomen: soft/nontender/nondistended/normal bowel sounds. No rebound or guarding.  Ext: no edema Skin: warm, dry, no rash Neuro: grossly normal, moves all extremities  Assessment/Plan: 1. Acute maxillary sinusitis, recurrence not specified Exam and history support treatment with Augmentin. Advised  patient on supportive measures:  Get rest, drink plenty of fluids, and use tylenol or ibuprofen as needed for pain. Follow up if fever >101, if symptoms worsen or if symptoms are not improved in 3 to 4 days. Patient verbalizes understanding.   - amoxicillin-clavulanate (AUGMENTIN) 875-125 MG tablet; Take 1 tablet by mouth 2 (two) times daily.  Dispense: 20 tablet; Refill: 0  2. Cough  - promethazine-dextromethorphan (PROMETHAZINE-DM) 6.25-15 MG/5ML syrup; Take 5 mLs by mouth 4 (four) times daily as needed for cough.  Dispense: 118 mL; Refill: 0   Finally, we reviewed reasons to return to care including if symptoms worsen or persist or new concerns arise- once again particularly shortness of breath or fever.   Inez CatalinaJulia Ann Eleisha Branscomb, FNP

## 2016-12-03 NOTE — Patient Instructions (Signed)
It was a pleasure to see you today! Please take medication with food as directed and follow up with Dr. Clent Ridges if your symptoms do not improve with treatment in 3 to 4 days, worsen, or you develop a fever >101.   Sinusitis, Adult Sinusitis is soreness and inflammation of your sinuses. Sinuses are hollow spaces in the bones around your face. Your sinuses are located:  Around your eyes.  In the middle of your forehead.  Behind your nose.  In your cheekbones.  Your sinuses and nasal passages are lined with a stringy fluid (mucus). Mucus normally drains out of your sinuses. When your nasal tissues become inflamed or swollen, the mucus can become trapped or blocked so air cannot flow through your sinuses. This allows bacteria, viruses, and funguses to grow, which leads to infection. Sinusitis can develop quickly and last for 7?10 days (acute) or for more than 12 weeks (chronic). Sinusitis often develops after a cold. What are the causes? This condition is caused by anything that creates swelling in the sinuses or stops mucus from draining, including:  Allergies.  Asthma.  Bacterial or viral infection.  Abnormally shaped bones between the nasal passages.  Nasal growths that contain mucus (nasal polyps).  Narrow sinus openings.  Pollutants, such as chemicals or irritants in the air.  A foreign object stuck in the nose.  A fungal infection. This is rare.  What increases the risk? The following factors may make you more likely to develop this condition:  Having allergies or asthma.  Having had a recent cold or respiratory tract infection.  Having structural deformities or blockages in your nose or sinuses.  Having a weak immune system.  Doing a lot of swimming or diving.  Overusing nasal sprays.  Smoking.  What are the signs or symptoms? The main symptoms of this condition are pain and a feeling of pressure around the affected sinuses. Other symptoms include:  Upper  toothache.  Earache.  Headache.  Bad breath.  Decreased sense of smell and taste.  A cough that may get worse at night.  Fatigue.  Fever.  Thick drainage from your nose. The drainage is often green and it may contain pus (purulent).  Stuffy nose or congestion.  Postnasal drip. This is when extra mucus collects in the throat or back of the nose.  Swelling and warmth over the affected sinuses.  Sore throat.  Sensitivity to light.  How is this diagnosed? This condition is diagnosed based on symptoms, a medical history, and a physical exam. To find out if your condition is acute or chronic, your health care provider may:  Look in your nose for signs of nasal polyps.  Tap over the affected sinus to check for signs of infection.  View the inside of your sinuses using an imaging device that has a light attached (endoscope).  If your health care provider suspects that you have chronic sinusitis, you may also:  Be tested for allergies.  Have a sample of mucus taken from your nose (nasal culture) and checked for bacteria.  Have a mucus sample examined to see if your sinusitis is related to an allergy.  If your sinusitis does not respond to treatment and it lasts longer than 8 weeks, you may have an MRI or CT scan to check your sinuses. These scans also help to determine how severe your infection is. In rare cases, a bone biopsy may be done to rule out more serious types of fungal sinus disease. How is this  treated? Treatment for sinusitis depends on the cause and whether your condition is chronic or acute. If a virus is causing your sinusitis, your symptoms will go away on their own within 10 days. You may be given medicines to relieve your symptoms, including:  Topical nasal decongestants. They shrink swollen nasal passages and let mucus drain from your sinuses.  Antihistamines. These drugs block inflammation that is triggered by allergies. This can help to ease swelling in  your nose and sinuses.  Topical nasal corticosteroids. These are nasal sprays that ease inflammation and swelling in your nose and sinuses.  Nasal saline washes. These rinses can help to get rid of thick mucus in your nose.  If your condition is caused by bacteria, you will be given an antibiotic medicine. If your condition is caused by a fungus, you will be given an antifungal medicine. Surgery may be needed to correct underlying conditions, such as narrow nasal passages. Surgery may also be needed to remove polyps. Follow these instructions at home: Medicines  Take, use, or apply over-the-counter and prescription medicines only as told by your health care provider. These may include nasal sprays.  If you were prescribed an antibiotic medicine, take it as told by your health care provider. Do not stop taking the antibiotic even if you start to feel better. Hydrate and Humidify  Drink enough water to keep your urine clear or pale yellow. Staying hydrated will help to thin your mucus.  Use a cool mist humidifier to keep the humidity level in your home above 50%.  Inhale steam for 10-15 minutes, 3-4 times a day or as told by your health care provider. You can do this in the bathroom while a hot shower is running.  Limit your exposure to cool or dry air. Rest  Rest as much as possible.  Sleep with your head raised (elevated).  Make sure to get enough sleep each night. General instructions  Apply a warm, moist washcloth to your face 3-4 times a day or as told by your health care provider. This will help with discomfort.  Wash your hands often with soap and water to reduce your exposure to viruses and other germs. If soap and water are not available, use hand sanitizer.  Do not smoke. Avoid being around people who are smoking (secondhand smoke).  Keep all follow-up visits as told by your health care provider. This is important. Contact a health care provider if:  You have a  fever.  Your symptoms get worse.  Your symptoms do not improve within 10 days. Get help right away if:  You have a severe headache.  You have persistent vomiting.  You have pain or swelling around your face or eyes.  You have vision problems.  You develop confusion.  Your neck is stiff.  You have trouble breathing. This information is not intended to replace advice given to you by your health care provider. Make sure you discuss any questions you have with your health care provider. Document Released: 05/11/2005 Document Revised: 01/05/2016 Document Reviewed: 03/06/2015 Elsevier Interactive Patient Education  2017 [employer]

## 2017-02-11 ENCOUNTER — Encounter: Payer: Self-pay | Admitting: Family Medicine

## 2017-03-05 ENCOUNTER — Encounter: Payer: Self-pay | Admitting: Family Medicine

## 2017-03-05 ENCOUNTER — Ambulatory Visit (INDEPENDENT_AMBULATORY_CARE_PROVIDER_SITE_OTHER): Payer: 59 | Admitting: Family Medicine

## 2017-03-05 VITALS — BP 108/64 | Temp 98.3°F | Ht 67.0 in | Wt 280.0 lb

## 2017-03-05 DIAGNOSIS — M5431 Sciatica, right side: Secondary | ICD-10-CM | POA: Diagnosis not present

## 2017-03-05 MED ORDER — CYCLOBENZAPRINE HCL 10 MG PO TABS: 10.0000 mg | ORAL_TABLET | Freq: Three times a day (TID) | ORAL | 0 refills | Status: DC | PRN

## 2017-03-05 MED ORDER — METHYLPREDNISOLONE 4 MG PO TBPK: ORAL_TABLET | ORAL | 0 refills | Status: DC

## 2017-03-05 MED ORDER — METHYLPREDNISOLONE ACETATE 80 MG/ML IJ SUSP
120.0000 mg | Freq: Once | INTRAMUSCULAR | Status: AC
  Administered 2017-03-05: 120 mg via INTRAMUSCULAR

## 2017-03-05 NOTE — Addendum Note (Signed)
Addended by: Aggie Hacker A on: 03/05/2017 03:03 PM   Modules accepted: Orders

## 2017-03-05 NOTE — Progress Notes (Signed)
   Subjective:    Patient ID: Kristi Ewing, female    DOB: 1995/10/31, 21 y.o.   MRN: [id number]  HPI Here for 3 days of sharp pain in the right lower back that radiates down the right leg to the knee. No numbness or weakness. No recent trauma but she does some lifting on her job. She has used heat and Tylenol with little relief. She has never had back pain before.    Review of Systems  Constitutional: Negative.   Respiratory: Negative.   Cardiovascular: Negative.   Musculoskeletal: Positive for back pain.       Objective:   Physical Exam  Constitutional: She is oriented to person, place, and time. She appears well-developed and well-nourished.  Cardiovascular: Normal rate, regular rhythm, normal heart sounds and intact distal pulses.   Pulmonary/Chest: Effort normal and breath sounds normal. No respiratory distress. She has no wheezes. She has no rales.  Musculoskeletal:  She is tender in the right lower back and over the right sciatic notch. Minimal spasm is present. Full ROM and negative SLR.   Neurological: She is alert and oriented to person, place, and time.          Assessment & Plan:  Sciatica. Apply moist heat. Try a Medrol dose pack and Flexeril. Written out of work yesterday and today.  Gershon Crane, MD

## 2017-03-05 NOTE — Patient Instructions (Signed)
WE NOW OFFER   [facility] FAST TRACK!!!  SAME DAY Appointments for ACUTE CARE  Such as: Sprains, Injuries, cuts, abrasions, rashes, muscle pain, joint pain, back pain Colds, flu, sore throats, headache, allergies, cough, fever  Ear pain, sinus and eye infections Abdominal pain, nausea, vomiting, diarrhea, upset stomach Animal/insect bites  3 Easy Ways to Schedule: Walk-In Scheduling Call in scheduling Mychart Sign-up: [email or web address]         

## 2017-03-25 ENCOUNTER — Other Ambulatory Visit: Payer: Self-pay | Admitting: Family Medicine

## 2017-04-06 ENCOUNTER — Other Ambulatory Visit: Payer: Self-pay | Admitting: Family Medicine

## 2017-04-06 NOTE — Telephone Encounter (Signed)
Sent to PCP for approval. Last OV was 03/05/2017 for sciatica right side. Last refilled 03/05/2017 disp 60 tablets with no refills.

## 2017-07-23 ENCOUNTER — Encounter: Payer: Self-pay | Admitting: Family Medicine

## 2017-07-23 ENCOUNTER — Ambulatory Visit (INDEPENDENT_AMBULATORY_CARE_PROVIDER_SITE_OTHER): Payer: 59 | Admitting: Family Medicine

## 2017-07-23 ENCOUNTER — Telehealth: Payer: Self-pay | Admitting: Family Medicine

## 2017-07-23 VITALS — BP 102/80 | HR 94 | Temp 98.2°F | Ht 67.0 in | Wt 291.8 lb

## 2017-07-23 DIAGNOSIS — J018 Other acute sinusitis: Secondary | ICD-10-CM | POA: Diagnosis not present

## 2017-07-23 DIAGNOSIS — J029 Acute pharyngitis, unspecified: Secondary | ICD-10-CM

## 2017-07-23 LAB — POCT RAPID STREP A (OFFICE): RAPID STREP A SCREEN: NEGATIVE

## 2017-07-23 MED ORDER — HYDROCODONE-HOMATROPINE 5-1.5 MG/5ML PO SYRP: 5.0000 mL | ORAL_SOLUTION | ORAL | 0 refills | Status: DC | PRN

## 2017-07-23 MED ORDER — AZITHROMYCIN 250 MG PO TABS
ORAL_TABLET | ORAL | 0 refills | Status: DC
Start: 2017-07-23 — End: 2017-08-11

## 2017-07-23 NOTE — Telephone Encounter (Signed)
Copied from [place] [id number]. Topic: Quick Communication - See Telephone Encounter >> Jul 23, 2017  5:14 PM Arletha Grippe wrote: CRM for notification. See Telephone encounter for:   07/23/17.[facility] HYDROcodone-homatropine (HYDROMET) 5-1.5 MG/5ML syrup - [facility] [address] [place] [facility] not [facility] - [facility] on [address]? it is in stock at that location

## 2017-07-23 NOTE — Progress Notes (Signed)
   Subjective:    Patient ID: Dorita SciaraMackenzie R Peeks, female    DOB: 11-Dec-1995, 22 y.o.   MRN: [id number]  HPI Here for 4 days of stuffy head, PND, ST, and a dry cough. Delsym did not help.    Review of Systems  Constitutional: Positive for fever.  HENT: Positive for congestion, postnasal drip, sinus pressure, sinus pain and sore throat.   Eyes: Negative.   Respiratory: Positive for cough.        Objective:   Physical Exam  Constitutional: She appears well-developed and well-nourished.  HENT:  Right Ear: External ear normal.  Left Ear: External ear normal.  Nose: Nose normal.  Mouth/Throat: Oropharynx is clear and moist.  Eyes: Conjunctivae are normal.  Neck: No thyromegaly present.  Pulmonary/Chest: Effort normal and breath sounds normal. No respiratory distress. She has no wheezes. She has no rales. She exhibits no tenderness.  Lymphadenopathy:    She has no cervical adenopathy.          Assessment & Plan:  Sinusitis, treat with a Zpack.  Gershon CraneStephen Barbette Mcglaun, MD

## 2017-07-26 MED ORDER — HYDROCODONE-HOMATROPINE 5-1.5 MG/5ML PO SYRP: 5.0000 mL | ORAL_SOLUTION | ORAL | 0 refills | Status: DC | PRN

## 2017-07-26 NOTE — Telephone Encounter (Signed)
Called pt and left a VM that Rx has been resent to requested pharmacy.

## 2017-07-26 NOTE — Telephone Encounter (Signed)
Called [facility] and canceled Rx. Sent to PCP to re send to requested pharmacy. Pharmacy has been updated.

## 2017-07-26 NOTE — Telephone Encounter (Signed)
Done

## 2017-08-11 ENCOUNTER — Encounter: Payer: Self-pay | Admitting: Family Medicine

## 2017-08-11 ENCOUNTER — Ambulatory Visit (INDEPENDENT_AMBULATORY_CARE_PROVIDER_SITE_OTHER): Payer: 59 | Admitting: Family Medicine

## 2017-08-11 VITALS — BP 116/72 | HR 96 | Temp 98.3°F | Wt 291.2 lb

## 2017-08-11 DIAGNOSIS — F411 Generalized anxiety disorder: Secondary | ICD-10-CM

## 2017-08-11 MED ORDER — ESCITALOPRAM OXALATE 20 MG PO TABS: 20.0000 mg | ORAL_TABLET | Freq: Every day | ORAL | 5 refills | Status: DC

## 2017-08-11 MED ORDER — ALPRAZOLAM 0.5 MG PO TABS: 0.5000 mg | ORAL_TABLET | Freq: Two times a day (BID) | ORAL | 2 refills | Status: DC | PRN

## 2017-08-11 NOTE — Progress Notes (Signed)
   Subjective:    Patient ID: Kristi Ewing, female    DOB: 11-15-1995, 22 y.o.   MRN: [id number]  HPI Here for anxiety. She was started on Lexapro 10 mg daily by her GYN, Dr. Rana SnareLowe, about 2 years ago, and she would supplement this with Xanax as needed. This worked well for her for about 18 months, but lately the anxiety has gotten worse again. She sleeps well.    Review of Systems  Constitutional: Negative.   Respiratory: Negative.   Cardiovascular: Negative.   Neurological: Negative.   Psychiatric/Behavioral: Negative for agitation, behavioral problems, confusion, decreased concentration, dysphoric mood, hallucinations and sleep disturbance. The patient is nervous/anxious.        Objective:   Physical Exam  Constitutional: She is oriented to person, place, and time. She appears well-developed and well-nourished.  Cardiovascular: Normal rate, regular rhythm, normal heart sounds and intact distal pulses.  Pulmonary/Chest: Effort normal and breath sounds normal. No respiratory distress. She has no wheezes. She has no rales.  Neurological: She is alert and oriented to person, place, and time.  Psychiatric: She has a normal mood and affect. Her behavior is normal. Thought content normal.          Assessment & Plan:  Anxiety, we will increase the Lexapro to 20 mg daily. Use Xanax as needed.  Gershon CraneStephen Jhordyn Hoopingarner, MD

## 2017-09-28 ENCOUNTER — Other Ambulatory Visit: Payer: Self-pay | Admitting: Family Medicine

## 2018-01-18 ENCOUNTER — Encounter (HOSPITAL_COMMUNITY): Payer: Self-pay | Admitting: Emergency Medicine

## 2018-01-18 ENCOUNTER — Emergency Department (HOSPITAL_COMMUNITY): Payer: 59

## 2018-01-18 ENCOUNTER — Other Ambulatory Visit: Payer: Self-pay

## 2018-01-18 ENCOUNTER — Observation Stay (HOSPITAL_COMMUNITY)
Admission: EM | Admit: 2018-01-18 | Discharge: 2018-01-19 | Disposition: A | Discharge status: Home / Self Care | Payer: 59 | Attending: Surgery | Admitting: Surgery

## 2018-01-18 DIAGNOSIS — Z419 Encounter for procedure for purposes other than remedying health state, unspecified: Secondary | ICD-10-CM

## 2018-01-18 DIAGNOSIS — J45909 Unspecified asthma, uncomplicated: Secondary | ICD-10-CM | POA: Insufficient documentation

## 2018-01-18 DIAGNOSIS — F411 Generalized anxiety disorder: Secondary | ICD-10-CM | POA: Diagnosis present

## 2018-01-18 DIAGNOSIS — K7581 Nonalcoholic steatohepatitis (NASH): Secondary | ICD-10-CM | POA: Insufficient documentation

## 2018-01-18 DIAGNOSIS — Z885 Allergy status to narcotic agent status: Secondary | ICD-10-CM | POA: Insufficient documentation

## 2018-01-18 DIAGNOSIS — Z79899 Other long term (current) drug therapy: Secondary | ICD-10-CM | POA: Insufficient documentation

## 2018-01-18 DIAGNOSIS — K8010 Calculus of gallbladder with chronic cholecystitis without obstruction: Principal | ICD-10-CM | POA: Insufficient documentation

## 2018-01-18 DIAGNOSIS — E282 Polycystic ovarian syndrome: Secondary | ICD-10-CM | POA: Diagnosis present

## 2018-01-18 DIAGNOSIS — Z6841 Body Mass Index (BMI) 40.0 and over, adult: Secondary | ICD-10-CM | POA: Insufficient documentation

## 2018-01-18 DIAGNOSIS — K8050 Calculus of bile duct without cholangitis or cholecystitis without obstruction: Secondary | ICD-10-CM

## 2018-01-18 DIAGNOSIS — F419 Anxiety disorder, unspecified: Secondary | ICD-10-CM | POA: Insufficient documentation

## 2018-01-18 LAB — CBC WITH DIFFERENTIAL/PLATELET
BASOS PCT: 0 %
Basophils Absolute: 0 10*3/uL (ref 0.0–0.1)
EOS ABS: 0.1 10*3/uL (ref 0.0–0.7)
Eosinophils Relative: 1 %
HCT: 39.8 % (ref 36.0–46.0)
HEMOGLOBIN: 13 g/dL (ref 12.0–15.0)
Lymphocytes Relative: 15 %
Lymphs Abs: 1.9 10*3/uL (ref 0.7–4.0)
MCH: 28.3 pg (ref 26.0–34.0)
MCHC: 32.7 g/dL (ref 30.0–36.0)
MCV: 86.7 fL (ref 78.0–100.0)
Monocytes Absolute: 0.8 10*3/uL (ref 0.1–1.0)
Monocytes Relative: 6 %
Neutro Abs: 10 10*3/uL — ABNORMAL HIGH (ref 1.7–7.7)
Neutrophils Relative %: 78 %
PLATELETS: 445 10*3/uL — AB (ref 150–400)
RBC: 4.59 MIL/uL (ref 3.87–5.11)
RDW: 12.8 % (ref 11.5–15.5)
WBC: 12.8 10*3/uL — AB (ref 4.0–10.5)

## 2018-01-18 LAB — COMPREHENSIVE METABOLIC PANEL
ALK PHOS: 64 U/L (ref 38–126)
ALT: 63 U/L — ABNORMAL HIGH (ref 0–44)
AST: 92 U/L — ABNORMAL HIGH (ref 15–41)
Albumin: 4.2 g/dL (ref 3.5–5.0)
Anion gap: 12 (ref 5–15)
BILIRUBIN TOTAL: 0.6 mg/dL (ref 0.3–1.2)
BUN: 14 mg/dL (ref 6–20)
CALCIUM: 9.7 mg/dL (ref 8.9–10.3)
CO2: 24 mmol/L (ref 22–32)
Chloride: 105 mmol/L (ref 98–111)
Creatinine, Ser: 0.74 mg/dL (ref 0.44–1.00)
Glucose, Bld: 114 mg/dL — ABNORMAL HIGH (ref 70–99)
Potassium: 4.1 mmol/L (ref 3.5–5.1)
SODIUM: 141 mmol/L (ref 135–145)
TOTAL PROTEIN: 7.6 g/dL (ref 6.5–8.1)

## 2018-01-18 LAB — I-STAT BETA HCG BLOOD, ED (MC, WL, AP ONLY)

## 2018-01-18 LAB — LIPASE, BLOOD: LIPASE: 46 U/L (ref 11–51)

## 2018-01-18 MED ORDER — SODIUM CHLORIDE 0.45 % IV BOLUS
500.0000 mL | Freq: Once | INTRAVENOUS | Status: AC
  Administered 2018-01-18: 500 mL via INTRAVENOUS

## 2018-01-18 MED ORDER — ONDANSETRON HCL 4 MG/2ML IJ SOLN
4.0000 mg | Freq: Four times a day (QID) | INTRAMUSCULAR | Status: DC | PRN
  Administered 2018-01-19: 4 mg via INTRAVENOUS
  Filled 2018-01-18: qty 2

## 2018-01-18 MED ORDER — NORGESTIMATE-ETH ESTRADIOL 0.25-35 MG-MCG PO TABS: 1.0000 | ORAL_TABLET | Freq: Every day | ORAL | Status: DC

## 2018-01-18 MED ORDER — ESCITALOPRAM OXALATE 20 MG PO TABS
20.0000 mg | ORAL_TABLET | Freq: Every day | ORAL | Status: DC
  Administered 2018-01-18: 20 mg via ORAL
  Filled 2018-01-18 (×2): qty 1

## 2018-01-18 MED ORDER — FAMOTIDINE 20 MG PO TABS: 20.0000 mg | ORAL_TABLET | Freq: Two times a day (BID) | ORAL | Status: DC | PRN

## 2018-01-18 MED ORDER — SIMETHICONE 80 MG PO CHEW: 40.0000 mg | CHEWABLE_TABLET | Freq: Four times a day (QID) | ORAL | Status: DC | PRN

## 2018-01-18 MED ORDER — ONDANSETRON HCL 4 MG/2ML IJ SOLN
4.0000 mg | Freq: Once | INTRAMUSCULAR | Status: AC
  Administered 2018-01-18: 4 mg via INTRAVENOUS
  Filled 2018-01-18: qty 2

## 2018-01-18 MED ORDER — ACETAMINOPHEN 325 MG PO TABS
650.0000 mg | ORAL_TABLET | Freq: Four times a day (QID) | ORAL | Status: DC | PRN
  Administered 2018-01-18 – 2018-01-19 (×2): 650 mg via ORAL
  Filled 2018-01-18 (×2): qty 2

## 2018-01-18 MED ORDER — ACETAMINOPHEN 650 MG RE SUPP: 650.0000 mg | Freq: Four times a day (QID) | RECTAL | Status: DC | PRN

## 2018-01-18 MED ORDER — DOCUSATE SODIUM 100 MG PO CAPS
100.0000 mg | ORAL_CAPSULE | Freq: Two times a day (BID) | ORAL | Status: DC
  Administered 2018-01-18: 100 mg via ORAL
  Filled 2018-01-18: qty 1

## 2018-01-18 MED ORDER — POLYETHYLENE GLYCOL 3350 17 G PO PACK: 17.0000 g | PACK | Freq: Every day | ORAL | Status: DC | PRN

## 2018-01-18 MED ORDER — ENOXAPARIN SODIUM 40 MG/0.4ML ~~LOC~~ SOLN: 40.0000 mg | SUBCUTANEOUS | Status: DC

## 2018-01-18 MED ORDER — DIPHENHYDRAMINE HCL 50 MG/ML IJ SOLN: 25.0000 mg | Freq: Four times a day (QID) | INTRAMUSCULAR | Status: DC | PRN

## 2018-01-18 MED ORDER — SODIUM CHLORIDE 0.9 % IV BOLUS: 500.0000 mL | Freq: Once | INTRAVENOUS | Status: DC

## 2018-01-18 MED ORDER — METHOCARBAMOL 500 MG PO TABS: 500.0000 mg | ORAL_TABLET | Freq: Three times a day (TID) | ORAL | Status: DC | PRN

## 2018-01-18 MED ORDER — METOPROLOL TARTRATE 5 MG/5ML IV SOLN: 5.0000 mg | Freq: Four times a day (QID) | INTRAVENOUS | Status: DC | PRN

## 2018-01-18 MED ORDER — MORPHINE SULFATE (PF) 2 MG/ML IV SOLN
2.0000 mg | INTRAVENOUS | Status: DC | PRN
  Administered 2018-01-18: 4 mg via INTRAVENOUS
  Administered 2018-01-18 (×2): 2 mg via INTRAVENOUS
  Administered 2018-01-19: 4 mg via INTRAVENOUS
  Filled 2018-01-18 (×2): qty 2
  Filled 2018-01-18 (×2): qty 1

## 2018-01-18 MED ORDER — SODIUM CHLORIDE 0.9 % IV SOLN
2.0000 g | INTRAVENOUS | Status: AC
  Administered 2018-01-19: 2 g via INTRAVENOUS
  Filled 2018-01-18: qty 20
  Filled 2018-01-18: qty 2

## 2018-01-18 MED ORDER — POTASSIUM CHLORIDE IN NACL 20-0.45 MEQ/L-% IV SOLN
INTRAVENOUS | Status: DC
  Administered 2018-01-19 (×2): via INTRAVENOUS
  Filled 2018-01-18 (×3): qty 1000

## 2018-01-18 MED ORDER — BISACODYL 10 MG RE SUPP: 10.0000 mg | Freq: Every day | RECTAL | Status: DC | PRN

## 2018-01-18 MED ORDER — MORPHINE SULFATE (PF) 4 MG/ML IV SOLN
8.0000 mg | Freq: Once | INTRAVENOUS | Status: AC
  Administered 2018-01-18: 8 mg via INTRAVENOUS
  Filled 2018-01-18: qty 2

## 2018-01-18 MED ORDER — DIPHENHYDRAMINE HCL 25 MG PO CAPS: 25.0000 mg | ORAL_CAPSULE | Freq: Four times a day (QID) | ORAL | Status: DC | PRN

## 2018-01-18 MED ORDER — ONDANSETRON 4 MG PO TBDP
4.0000 mg | ORAL_TABLET | Freq: Four times a day (QID) | ORAL | Status: DC | PRN
  Filled 2018-01-18: qty 1

## 2018-01-18 MED ORDER — SODIUM CHLORIDE 0.45 % IV SOLN
INTRAVENOUS | Status: DC
  Administered 2018-01-18: 17:00:00 via INTRAVENOUS

## 2018-01-18 MED ORDER — OXYCODONE HCL 5 MG PO TABS: 5.0000 mg | ORAL_TABLET | ORAL | Status: DC | PRN

## 2018-01-18 MED ORDER — IOPAMIDOL (ISOVUE-300) INJECTION 61%
INTRAVENOUS | Status: AC
  Filled 2018-01-18: qty 100

## 2018-01-18 MED ORDER — ALPRAZOLAM 0.5 MG PO TABS: 0.5000 mg | ORAL_TABLET | Freq: Two times a day (BID) | ORAL | Status: DC | PRN

## 2018-01-18 MED ORDER — IOPAMIDOL (ISOVUE-300) INJECTION 61%
100.0000 mL | Freq: Once | INTRAVENOUS | Status: AC | PRN
  Administered 2018-01-18: 100 mL via INTRAVENOUS

## 2018-01-18 NOTE — H&P (Signed)
[facility] Surgery Admission Note  Kristi Ewing Sep 12, 1995  [id number].    Requesting MD: Davonna Belling Chief Complaint/Reason for Consult: symptomatic cholelithiasis  HPI:  Kristi Ewing is a 22yo female who presented to [facility] earlier today with acute onset abdominal pain. Patient works night shift and around [place] she felt a severe, sharp, stabbing pain in her epigastric area. It was constant and radiated into her back. States that she has never had pain like this before. Associated with nausea. Denies emesis, fever, chills, diarrhea, dysuria.  ED workup included CT abdomen/pelvis and an abdominal u/s, both which showed cholelithiasis without signs of acute cholecystitis. WBC 12.8, lipase 46, AST 92, ALT 63, alk phos 64, bilirubin 0.6.  Pain uncontrolled in the ED with morphine. General surgery asked to see for consideration for cholecystectomy.  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   HENT: Negative.   Eyes: Negative.   Respiratory: Negative.   Cardiovascular: Negative.   Gastrointestinal: Positive for abdominal pain and nausea. Negative for diarrhea and vomiting.  Genitourinary: Negative.   Musculoskeletal: Positive for back pain.  Skin: Negative.   Neurological: Negative.     All systems reviewed and otherwise negative except for as above  Family History  Problem Relation Age of Onset  . Diabetes Unknown   . Anxiety disorder Unknown   . Hypertension Mother   . Diabetes Father   . Hypertension Father     Past Medical History:  Diagnosis Date  . Asthma    childhood  . Kidney stone   . PCOS (polycystic ovarian syndrome)    sees Dr. Louretta Shorten     History reviewed. No pertinent surgical history.  Social History:  reports that she has never smoked. She has never used smokeless tobacco. She reports  that she does not drink alcohol or use drugs.  Allergies:  Allergies  Allergen Reactions  . Hydrocodone-Homatropine     Rash/hives      (Not in a hospital admission)  Prior to Admission medications   Medication Sig Start Date End Date Taking? Authorizing Provider  ALPRAZolam Duanne Moron) 0.5 MG tablet Take 1 tablet (0.5 mg total) by mouth 2 (two) times daily as needed for anxiety. 08/11/17  Yes [name], MD  escitalopram (LEXAPRO) 20 MG tablet TAKE 1 TABLET BY MOUTH EVERY DAY Patient taking differently: Take 20 mg by mouth daily.  09/28/17  Yes [name], MD  norgestimate-ethinyl estradiol (PREVIFEM) 0.25-35 MG-MCG tablet Take 1 tablet by mouth daily.   Yes [provider]    Blood pressure (!) 114/52, pulse (!) 51, temperature 97.8 F (36.6 C), temperature source Oral, resp. rate 18, height _0  (1.702 m), weight 136.1 kg, last menstrual period 01/11/2018, SpO2 97 %. Physical Exam: General: pleasant, WD/WN white female who is laying in bed in NAD HEENT: head is normocephalic, atraumatic.  Sclera are noninjected.  Pupils equal and round.  Ears and nose without any masses or lesions.  Mouth is pink and moist. Dentition fair Heart: regular, rate, and rhythm.  No obvious murmurs, gallops, or rubs noted.  Palpable pedal pulses bilaterally Lungs: CTAB, no wheezes, rhonchi, or rales noted.  Respiratory effort nonlabored Abd: soft, ND, +BS, no masses, hernias, or organomegaly. TTP epigastric region without rebound or guarding, mild TTP RUQ MS: all 4 extremities are symmetrical with no cyanosis, clubbing, or edema. Skin: warm and  dry with no masses, lesions, or rashes Psych: A&Ox3 with an appropriate affect. Neuro: cranial nerves grossly intact, extremity CSM intact bilaterally, normal speech  Results for orders placed or performed during the hospital encounter of 01/18/18 (from the past 48 hour(s))  I-Stat beta hCG blood, ED     Status: None   Collection Time: 01/18/18  6:58  AM  Result Value Ref Range   I-stat hCG, quantitative <5.0 <5 mIU/mL   Comment 3            Comment:   GEST. AGE      CONC.  (mIU/mL)   <=1 WEEK        5 - 50     2 WEEKS       50 - 500     3 WEEKS       100 - 10,000     4 WEEKS     1,000 - 30,000        FEMALE AND NON-PREGNANT FEMALE:     LESS THAN 5 mIU/mL   Comprehensive metabolic panel     Status: Abnormal   Collection Time: 01/18/18  7:00 AM  Result Value Ref Range   Sodium 141 135 - 145 mmol/L   Potassium 4.1 3.5 - 5.1 mmol/L   Chloride 105 98 - 111 mmol/L   CO2 24 22 - 32 mmol/L   Glucose, Bld 114 (H) 70 - 99 mg/dL   BUN 14 6 - 20 mg/dL   Creatinine, Ser 0.74 0.44 - 1.00 mg/dL   Calcium 9.7 8.9 - 10.3 mg/dL   Total Protein 7.6 6.5 - 8.1 g/dL   Albumin 4.2 3.5 - 5.0 g/dL   AST 92 (H) 15 - 41 U/L   ALT 63 (H) 0 - 44 U/L   Alkaline Phosphatase 64 38 - 126 U/L   Total Bilirubin 0.6 0.3 - 1.2 mg/dL   GFR calc non Af Amer >60 >60 mL/min   GFR calc Af Amer >60 >60 mL/min    Comment: (NOTE) The eGFR has been calculated using the CKD EPI equation. This calculation has not been validated in all clinical situations. eGFR's persistently <60 mL/min signify possible Chronic Kidney Disease.    Anion gap 12 5 - 15    Comment: Performed at [facility], [place] [address], [place], [place] [address]  Lipase, blood     Status: None   Collection Time: 01/18/18  7:00 AM  Result Value Ref Range   Lipase 46 11 - 51 U/L    Comment: Performed at [facility], [place] [address] [place], [place] [address]  CBC with Diff     Status: Abnormal   Collection Time: 01/18/18  7:00 AM  Result Value Ref Range   WBC 12.8 (H) 4.0 - 10.5 K/uL   RBC 4.59 3.87 - 5.11 MIL/uL   Hemoglobin 13.0 12.0 - 15.0 g/dL   HCT 39.8 36.0 - 46.0 %   MCV 86.7 78.0 - 100.0 fL   MCH 28.3 26.0 - 34.0 pg   MCHC 32.7 30.0 - 36.0 g/dL   RDW 12.8 11.5 - 15.5 %   Platelets 445 (H) 150 - 400 K/uL   Neutrophils Relative % 78 %   Neutro  Abs 10.0 (H) 1.7 - 7.7 K/uL   Lymphocytes Relative 15 %   Lymphs Abs 1.9 0.7 - 4.0 K/uL   Monocytes Relative 6 %   Monocytes Absolute 0.8 0.1 - 1.0 K/uL   Eosinophils Relative 1 %  Eosinophils Absolute 0.1 0.0 - 0.7 K/uL   Basophils Relative 0 %   Basophils Absolute 0.0 0.0 - 0.1 K/uL    Comment: Performed at [facility], [place] [address] [place], [place] [address]   Ct Abdomen Pelvis W Contrast  Result Date: 01/18/2018 CLINICAL DATA:  Abdominal pain that is acute in generalized EXAM: CT ABDOMEN AND PELVIS WITH CONTRAST TECHNIQUE: Multidetector CT imaging of the abdomen and pelvis was performed using the standard protocol following bolus administration of intravenous contrast. CONTRAST:  167m ISOVUE-300 IOPAMIDOL (ISOVUE-300) INJECTION 61% COMPARISON:  11/19/2013 FINDINGS: Lower chest:  No contributory findings. Hepatobiliary: Hepatic steatosis and cholelithiasis.No acute finding. Pancreas: Unremarkable. Spleen: Unremarkable. Adrenals/Urinary Tract: Negative adrenals. No hydronephrosis or stone. Unremarkable bladder. Stomach/Bowel:  No obstruction. No appendicitis. Vascular/Lymphatic: No acute vascular abnormality. No mass or adenopathy. Reproductive:No pathologic findings. Other: No ascites or pneumoperitoneum. Small area of fat reticulation in the ventral abdomen, see 2:50, stable from 2015 and likely from remote inflammatory process such as fat infarct. Musculoskeletal: No acute abnormalities. L2-3 small disc protrusion with buttressing osteophyte. IMPRESSION: 1. No acute finding. 2. Cholelithiasis and hepatic steatosis. Electronically Signed   By: JMonte FantasiaM.D.   On: 01/18/2018 08:44   UKoreaAbdomen Limited  Result Date: 01/18/2018 CLINICAL DATA:  22y/o  F; epigastric pain. EXAM: ULTRASOUND ABDOMEN LIMITED RIGHT UPPER QUADRANT COMPARISON:  10/04/2016 renal ultrasound. FINDINGS: Gallbladder: Cholelithiasis. No gallbladder wall thickening. No pericholecystic fluid.  Negative sonographic Murphy's sign. Common bile duct: Diameter: 3 mm Liver: No focal liver lesion identified. Increased liver echogenicity. Portal vein is patent on color Doppler imaging with normal direction of blood flow towards the liver. IMPRESSION: 1. Cholelithiasis.  No secondary signs of acute cholecystitis. 2. Hepatic steatosis. Electronically Signed   By: LKristine GarbeM.D.   On: 01/18/2018 06:39      Assessment/Plan Anxiety - home meds Obesity  H/o kidney stones  Symptomatic cholelithiasis - Patient has mild leukocytosis and slight elevation in LFTs. Pain is uncontrolled in the ED.  Will admit to med-surg for pain control. OStorrsfor full liquids today, NPO after midnight. Plan for laparoscopic cholecystectomy tomorrow.  ID - rocephin on call to OR FEN - IVF, FLD, NPO after MN VTE - SCDs, lovenox Foley - none  BWellington [place] PFre[facility] Services[facility] 01/18/2018, 10:35 AM Pager: [phone]: [phone] 7:00 am -11:30 AM Tues-Fri 7:00 am-4:30 pm Sat-Sun 7:00 am-11:30 am

## 2018-01-18 NOTE — ED Provider Notes (Signed)
  Physical Exam  BP (!) 114/52   Pulse (!) 51   Temp 97.8 F (36.6 C) (Oral)   Resp 18   Ht 5' 7"  (1.702 m)   Wt 136.1 kg   LMP 01/11/2018   SpO2 97%   BMI 46.99 kg/m   Physical Exam  ED Course/Procedures     Procedures  MDM  CT scan shows only cholelithiasis.  However LFTs are somewhat elevated and had return of her pain.  Have discussed with general surgery who will see the patient for her symptomatic cholelithiasis.       Davonna Belling, MD 01/18/18 1028

## 2018-01-18 NOTE — ED Notes (Signed)
ED TO INPATIENT HANDOFF REPORT  Name/Age/Gender Kristi Ewing 22 y.o. female  Code Status   Home/SNF/Other Home  Chief Complaint chest pain   Level of Care/Admitting Diagnosis ED Disposition    ED Disposition Condition [facility] Area: [place] [100102]  Level of Care: Med-Surg [16]  Diagnosis: Symptomatic cholelithiasis [979892]  Admitting Physician: CCS, [place]  Attending Physician: CCS, [place]  Bed request comments: 5 west  PT Class (Do Not Modify): Observation [104]  PT Acc Code (Do Not Modify): Observation [10022]       Medical History Past Medical History:  Diagnosis Date  . Asthma    childhood  . Kidney stone   . PCOS (polycystic ovarian syndrome)    sees Dr. Louretta Shorten     Allergies Allergies  Allergen Reactions  . Hydrocodone-Homatropine     Rash/hives     IV Location/Drains/Wounds Patient Lines/Drains/Airways Status   Active Line/Drains/Airways    Name:   Placement date:   Placement time:   Site:   Days:   Peripheral IV 01/18/18 Left Forearm   01/18/18    0654    Forearm   less than 1          Labs/Imaging Results for orders placed or performed during the hospital encounter of 01/18/18 (from the past 48 hour(s))  I-Stat beta hCG blood, ED     Status: None   Collection Time: 01/18/18  6:58 AM  Result Value Ref Range   I-stat hCG, quantitative <5.0 <5 mIU/mL   Comment 3            Comment:   GEST. AGE      CONC.  (mIU/mL)   <=1 WEEK        5 - 50     2 WEEKS       50 - 500     3 WEEKS       100 - 10,000     4 WEEKS     1,000 - 30,000        FEMALE AND NON-PREGNANT FEMALE:     LESS THAN 5 mIU/mL   Comprehensive metabolic panel     Status: Abnormal   Collection Time: 01/18/18  7:00 AM  Result Value Ref Range   Sodium 141 135 - 145 mmol/L   Potassium 4.1 3.5 - 5.1 mmol/L   Chloride 105 98 - 111 mmol/L   CO2 24 22 - 32 mmol/L   Glucose, Bld 114 (H) 70 - 99 mg/dL   BUN 14 6 - 20 mg/dL    Creatinine, Ser 0.74 0.44 - 1.00 mg/dL   Calcium 9.7 8.9 - 10.3 mg/dL   Total Protein 7.6 6.5 - 8.1 g/dL   Albumin 4.2 3.5 - 5.0 g/dL   AST 92 (H) 15 - 41 U/L   ALT 63 (H) 0 - 44 U/L   Alkaline Phosphatase 64 38 - 126 U/L   Total Bilirubin 0.6 0.3 - 1.2 mg/dL   GFR calc non Af Amer >60 >60 mL/min   GFR calc Af Amer >60 >60 mL/min    Comment: (NOTE) The eGFR has been calculated using the CKD EPI equation. This calculation has not been validated in all clinical situations. eGFR's persistently <60 mL/min signify possible Chronic Kidney Disease.    Anion gap 12 5 - 15    Comment: Performed at [facility], [place] [address], [place],  [address]  Lipase, blood  Status: None   Collection Time: 01/18/18  7:00 AM  Result Value Ref Range   Lipase 46 11 - 51 U/L    Comment: Performed at [facility], [place] [address], [place], [place] [address]  CBC with Diff     Status: Abnormal   Collection Time: 01/18/18  7:00 AM  Result Value Ref Range   WBC 12.8 (H) 4.0 - 10.5 K/uL   RBC 4.59 3.87 - 5.11 MIL/uL   Hemoglobin 13.0 12.0 - 15.0 g/dL   HCT 39.8 36.0 - 46.0 %   MCV 86.7 78.0 - 100.0 fL   MCH 28.3 26.0 - 34.0 pg   MCHC 32.7 30.0 - 36.0 g/dL   RDW 12.8 11.5 - 15.5 %   Platelets 445 (H) 150 - 400 K/uL   Neutrophils Relative % 78 %   Neutro Abs 10.0 (H) 1.7 - 7.7 K/uL   Lymphocytes Relative 15 %   Lymphs Abs 1.9 0.7 - 4.0 K/uL   Monocytes Relative 6 %   Monocytes Absolute 0.8 0.1 - 1.0 K/uL   Eosinophils Relative 1 %   Eosinophils Absolute 0.1 0.0 - 0.7 K/uL   Basophils Relative 0 %   Basophils Absolute 0.0 0.0 - 0.1 K/uL    Comment: Performed at [facility], [place] [address] [place], [place] [address]   Ct Abdomen Pelvis W Contrast  Result Date: 01/18/2018 CLINICAL DATA:  Abdominal pain that is acute in generalized EXAM: CT ABDOMEN AND PELVIS WITH CONTRAST TECHNIQUE: Multidetector CT imaging of the abdomen and pelvis  was performed using the standard protocol following bolus administration of intravenous contrast. CONTRAST:  17m ISOVUE-300 IOPAMIDOL (ISOVUE-300) INJECTION 61% COMPARISON:  11/19/2013 FINDINGS: Lower chest:  No contributory findings. Hepatobiliary: Hepatic steatosis and cholelithiasis.No acute finding. Pancreas: Unremarkable. Spleen: Unremarkable. Adrenals/Urinary Tract: Negative adrenals. No hydronephrosis or stone. Unremarkable bladder. Stomach/Bowel:  No obstruction. No appendicitis. Vascular/Lymphatic: No acute vascular abnormality. No mass or adenopathy. Reproductive:No pathologic findings. Other: No ascites or pneumoperitoneum. Small area of fat reticulation in the ventral abdomen, see 2:50, stable from 2015 and likely from remote inflammatory process such as fat infarct. Musculoskeletal: No acute abnormalities. L2-3 small disc protrusion with buttressing osteophyte. IMPRESSION: 1. No acute finding. 2. Cholelithiasis and hepatic steatosis. Electronically Signed   By: JMonte FantasiaM.D.   On: 01/18/2018 08:44   UKoreaAbdomen Limited  Result Date: 01/18/2018 CLINICAL DATA:  22y/o  F; epigastric pain. EXAM: ULTRASOUND ABDOMEN LIMITED RIGHT UPPER QUADRANT COMPARISON:  10/04/2016 renal ultrasound. FINDINGS: Gallbladder: Cholelithiasis. No gallbladder wall thickening. No pericholecystic fluid. Negative sonographic Murphy's sign. Common bile duct: Diameter: 3 mm Liver: No focal liver lesion identified. Increased liver echogenicity. Portal vein is patent on color Doppler imaging with normal direction of blood flow towards the liver. IMPRESSION: 1. Cholelithiasis.  No secondary signs of acute cholecystitis. 2. Hepatic steatosis. Electronically Signed   By: LKristine GarbeM.D.   On: 01/18/2018 06:39    Pending Labs [employer](From admission, onward)    Start     Ordered   Signed and Held  HIV antibody (Routine Testing)  Once,   R     Signed and Held   Signed and Held  CBC  (enoxaparin  (LOVENOX)    CrCl >/= 30 ml/min)  Once,   R    Comments:  Baseline for enoxaparin therapy IF NOT ALREADY DRAWN.  Notify MD if PLT < 100 K.    Signed and Held   Signed and  Held  Creatinine, serum  (enoxaparin (LOVENOX)    CrCl >/= 30 ml/min)  Once,   R    Comments:  Baseline for enoxaparin therapy IF NOT ALREADY DRAWN.    Signed and Held   Signed and Held  Creatinine, serum  (enoxaparin (LOVENOX)    CrCl >/= 30 ml/min)  Weekly,   R    Comments:  while on enoxaparin therapy    Signed and Held   Signed and Held  Comprehensive metabolic panel  Tomorrow morning,   R     Signed and Held   Signed and Held  CBC  Tomorrow morning,   R     Signed and Held          Vitals/Pain Today's Vitals   01/18/18 1106 01/18/18 1145 01/18/18 1219 01/18/18 1330  BP: 114/73   114/64  Pulse: 76   61  Resp: 16   16  Temp:      TempSrc:      SpO2: 97%   98%  Weight:      Height:      PainSc:  6  3      Isolation Precautions No active isolations  Medications Medications  iopamidol (ISOVUE-300) 61 % injection (has no administration in time range)  ondansetron (ZOFRAN-ODT) disintegrating tablet 4 mg (has no administration in time range)    Or  ondansetron (ZOFRAN) injection 4 mg (has no administration in time range)  acetaminophen (TYLENOL) tablet 650 mg (650 mg Oral Given 01/18/18 1346)    Or  acetaminophen (TYLENOL) suppository 650 mg ( Rectal See Alternative 01/18/18 1346)  morphine 2 MG/ML injection 2-4 mg (2 mg Intravenous Given 01/18/18 1148)  morphine 4 MG/ML injection 8 mg (8 mg Intravenous Given 01/18/18 0757)  ondansetron (ZOFRAN) injection 4 mg (4 mg Intravenous Given 01/18/18 0750)  iopamidol (ISOVUE-300) 61 % injection 100 mL (100 mLs Intravenous Contrast Given 01/18/18 0805)    Mobility walks

## 2018-01-18 NOTE — ED Notes (Signed)
Patient transported to CT 

## 2018-01-18 NOTE — ED Provider Notes (Signed)
[facility] DEPT Provider Note   CSN: [id number] Arrival date & time: 01/18/18  0455     History   Chief Complaint Chief Complaint  Patient presents with  . Abdominal Pain    HPI Kristi Ewing is a 22 y.o. female.  HPI 22 year old female with history of polycystic ovarian syndrome and renal stones comes in with chief complaint of epigastric pain. Patient reports that she woke up yesterday afternoon with some nausea and anorexia.  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  Patient describes the pain as sharp pain.  She has no history of similar pain in the past.  Patient denies any history of liver disease, gallstone problems, heavy alcohol use, smoking or substance abuse. She has no history of abdominal surgeries.  Past Medical History:  Diagnosis Date  . Asthma    childhood  . Kidney stone   . PCOS (polycystic ovarian syndrome)    sees Dr. Louretta Shorten     Patient Active Problem List   Diagnosis Date Noted  . PCOS (polycystic ovarian syndrome) 12/24/2014  . Pyrexia 08/24/2014  . Anxiety state 10/15/2009    History reviewed. No pertinent surgical history.   OB History   None      Home Medications    Prior to Admission medications   Medication Sig Start Date End Date Taking? Authorizing Provider  ALPRAZolam Duanne Moron) 0.5 MG tablet Take 1 tablet (0.5 mg total) by mouth 2 (two) times daily as needed for anxiety. 08/11/17   [name], MD  cyclobenzaprine (FLEXERIL) 10 MG tablet TAKE 1 TABLET BY MOUTH THREE TIMES A DAY AS NEEDED FOR MUSCLE SPASMS 04/07/17   [name], MD  escitalopram (LEXAPRO) 20 MG tablet TAKE 1 TABLET BY MOUTH EVERY DAY 09/28/17   [name], MD  norgestimate-ethinyl estradiol (PREVIFEM) 0.25-35 MG-MCG tablet Take 1 tablet  by mouth daily.    [provider]    Family History Family History  Problem Relation Age of Onset  . Diabetes Unknown   . Anxiety disorder Unknown   . Hypertension Mother   . Diabetes Father   . Hypertension Father     Social History Social History   Tobacco Use  . Smoking status: Never Smoker  . Smokeless tobacco: Never Used  Substance Use Topics  . Alcohol use: No    Alcohol/week: 0.0 standard drinks  . Drug use: No     Allergies   Hydrocodone-homatropine   Review of Systems Review of Systems  Constitutional: Positive for activity change.  Respiratory: Negative for shortness of breath.   Cardiovascular: Negative for chest pain.  Gastrointestinal: Positive for abdominal pain, nausea and vomiting.  All other systems reviewed and are negative.    Physical Exam Updated Vital Signs BP (!) 147/75 (BP Location: Left Arm)   Pulse 66   Temp 97.8 F (36.6 C) (Oral)   Resp 16   Ht 5' 7"  (1.702 m)   Wt 136.1 kg   LMP 01/11/2018   SpO2 100%   BMI 46.99 kg/m   Physical Exam  Constitutional: She is oriented to person, place, and time. She appears well-developed.  HENT:  Head: Normocephalic and atraumatic.  Eyes: EOM are normal.  Neck: Normal range of motion. Neck supple.  Cardiovascular: Normal rate.  Pulmonary/Chest: Effort normal.  Abdominal: Bowel sounds are normal. There is tenderness in the epigastric area. There is guarding. There is no rebound and negative Murphy's sign.  Neurological: She is alert and oriented to person, place, and time.  Skin: Skin is warm and dry.  Nursing note and vitals reviewed.    ED Treatments / Results  Labs (all labs ordered are listed, but only abnormal results are displayed) Labs Reviewed  COMPREHENSIVE METABOLIC PANEL  LIPASE, BLOOD  CBC WITH DIFFERENTIAL/PLATELET  I-STAT BETA HCG BLOOD, ED ([facility], [facility], [facility] ONLY)    EKG None  Radiology No results found.  Procedures Procedures (including critical care  time)  Medications Ordered in ED Medications - No data to display   Initial Impression / Assessment and Plan / ED Course  I have reviewed the triage vital signs and the nursing notes.  Pertinent labs & imaging results that were available during my care of the patient were reviewed by me and considered in my medical decision making (see chart for details).     22 year old female comes in with chief complaint of abdominal pain.  Patient has history of PCOS.  She reports having sudden onset severe epigastric abdominal pain that is radiating to her back starting about 2 hours ago.   DDx includes: Pancreatitis Hepatobiliary pathology including cholecystitis Gastritis/PUD SBO ACS syndrome Aortic Dissection  Based on history and evaluation, I suspect that patient may be having gallstone pancreatitis. We will start with ultrasound right upper quadrant along with basic labs.  If her imaging is negative then she will need a CT scan.  Based on history, there is no risk factors for dissection, and clinically patient is not having symptoms congruent with ACS.  Final Clinical Impressions(s) / ED Diagnoses   Final diagnoses:  None    ED Discharge Orders    None       Varney Biles, MD 01/18/18 [phone]

## 2018-01-18 NOTE — ED Triage Notes (Signed)
Pt reports having epigastric pain with radiation into back along with nausea and vomiting.

## 2018-01-19 ENCOUNTER — Encounter (HOSPITAL_COMMUNITY)
Admission: EM | Disposition: A | Discharge status: Home / Self Care | Payer: Self-pay | Source: Home / Self Care | Attending: Emergency Medicine

## 2018-01-19 ENCOUNTER — Encounter (HOSPITAL_COMMUNITY): Payer: Self-pay | Admitting: Anesthesiology

## 2018-01-19 ENCOUNTER — Observation Stay (HOSPITAL_COMMUNITY): Payer: 59 | Admitting: Anesthesiology

## 2018-01-19 ENCOUNTER — Observation Stay (HOSPITAL_COMMUNITY): Payer: 59

## 2018-01-19 HISTORY — PX: LAPAROSCOPIC CHOLECYSTECTOMY SINGLE PORT: SHX5891

## 2018-01-19 LAB — CBC
HEMATOCRIT: 37.8 % (ref 36.0–46.0)
Hemoglobin: 12.1 g/dL (ref 12.0–15.0)
MCH: 28.1 pg (ref 26.0–34.0)
MCHC: 32 g/dL (ref 30.0–36.0)
MCV: 87.7 fL (ref 78.0–100.0)
PLATELETS: 365 10*3/uL (ref 150–400)
RBC: 4.31 MIL/uL (ref 3.87–5.11)
RDW: 12.7 % (ref 11.5–15.5)
WBC: 7.1 10*3/uL (ref 4.0–10.5)

## 2018-01-19 LAB — SURGICAL PCR SCREEN
MRSA, PCR: NEGATIVE
Staphylococcus aureus: NEGATIVE

## 2018-01-19 LAB — COMPREHENSIVE METABOLIC PANEL
ALBUMIN: 3.7 g/dL (ref 3.5–5.0)
ALT: 62 U/L — AB (ref 0–44)
AST: 50 U/L — AB (ref 15–41)
Alkaline Phosphatase: 63 U/L (ref 38–126)
Anion gap: 9 (ref 5–15)
BUN: 9 mg/dL (ref 6–20)
CHLORIDE: 107 mmol/L (ref 98–111)
CO2: 25 mmol/L (ref 22–32)
CREATININE: 0.71 mg/dL (ref 0.44–1.00)
Calcium: 9 mg/dL (ref 8.9–10.3)
GFR calc Af Amer: >60 mL/min (ref >60)
GFR calc non Af Amer: >60 mL/min (ref >60)
Glucose, Bld: 86 mg/dL (ref 70–99)
Potassium: 4.2 mmol/L (ref 3.5–5.1)
SODIUM: 141 mmol/L (ref 135–145)
Total Bilirubin: 0.6 mg/dL (ref 0.3–1.2)
Total Protein: 6.7 g/dL (ref 6.5–8.1)

## 2018-01-19 LAB — HIV ANTIBODY (ROUTINE TESTING W REFLEX): HIV SCREEN 4TH GENERATION: NONREACTIVE

## 2018-01-19 SURGERY — LAPAROSCOPIC CHOLECYSTECTOMY SINGLE SITE
Anesthesia: General | Site: Abdomen

## 2018-01-19 MED ORDER — MAGIC MOUTHWASH
15.0000 mL | Freq: Four times a day (QID) | ORAL | Status: DC | PRN
  Filled 2018-01-19: qty 15

## 2018-01-19 MED ORDER — HYDROMORPHONE HCL 1 MG/ML IJ SOLN
0.2500 mg | INTRAMUSCULAR | Status: DC | PRN
  Administered 2018-01-19 (×4): 0.5 mg via INTRAVENOUS

## 2018-01-19 MED ORDER — GABAPENTIN 300 MG PO CAPS
300.0000 mg | ORAL_CAPSULE | ORAL | Status: DC
  Filled 2018-01-19: qty 1

## 2018-01-19 MED ORDER — KETAMINE HCL 10 MG/ML IJ SOLN
INTRAMUSCULAR | Status: AC
  Filled 2018-01-19: qty 1

## 2018-01-19 MED ORDER — ACETAMINOPHEN 500 MG PO TABS
1000.0000 mg | ORAL_TABLET | Freq: Three times a day (TID) | ORAL | Status: DC
  Administered 2018-01-19: 1000 mg via ORAL
  Filled 2018-01-19: qty 2

## 2018-01-19 MED ORDER — CELECOXIB 200 MG PO CAPS
200.0000 mg | ORAL_CAPSULE | ORAL | Status: AC
  Administered 2018-01-19: 200 mg via ORAL
  Filled 2018-01-19 (×2): qty 1

## 2018-01-19 MED ORDER — HYDROCORTISONE 1 % EX CREA: 1.0000 "application " | TOPICAL_CREAM | Freq: Three times a day (TID) | CUTANEOUS | Status: DC | PRN

## 2018-01-19 MED ORDER — 0.9 % SODIUM CHLORIDE (POUR BTL) OPTIME
TOPICAL | Status: DC | PRN
  Administered 2018-01-19: 1000 mL

## 2018-01-19 MED ORDER — HYDROMORPHONE HCL 1 MG/ML IJ SOLN
INTRAMUSCULAR | Status: AC
  Filled 2018-01-19: qty 1

## 2018-01-19 MED ORDER — BUPIVACAINE LIPOSOME 1.3 % IJ SUSP
INTRAMUSCULAR | Status: DC | PRN
  Administered 2018-01-19: 20 mL

## 2018-01-19 MED ORDER — ROCURONIUM BROMIDE 100 MG/10ML IV SOLN
INTRAVENOUS | Status: AC
  Filled 2018-01-19: qty 1

## 2018-01-19 MED ORDER — ONDANSETRON HCL 4 MG/2ML IJ SOLN
INTRAMUSCULAR | Status: DC | PRN
  Administered 2018-01-19: 4 mg via INTRAVENOUS

## 2018-01-19 MED ORDER — ACETAMINOPHEN 500 MG PO TABS
1000.0000 mg | ORAL_TABLET | ORAL | Status: DC
  Filled 2018-01-19: qty 2

## 2018-01-19 MED ORDER — METHOCARBAMOL 500 MG PO TABS: 1000.0000 mg | ORAL_TABLET | Freq: Four times a day (QID) | ORAL | Status: DC | PRN

## 2018-01-19 MED ORDER — IOPAMIDOL (ISOVUE-300) INJECTION 61%
INTRAVENOUS | Status: DC | PRN
  Administered 2018-01-19: 20 mL

## 2018-01-19 MED ORDER — SODIUM CHLORIDE 0.9% FLUSH: 3.0000 mL | INTRAVENOUS | Status: DC | PRN

## 2018-01-19 MED ORDER — BUPIVACAINE-EPINEPHRINE 0.25% -1:200000 IJ SOLN
INTRAMUSCULAR | Status: DC | PRN
  Administered 2018-01-19: 30 mL

## 2018-01-19 MED ORDER — BUPIVACAINE-EPINEPHRINE (PF) 0.25% -1:200000 IJ SOLN
INTRAMUSCULAR | Status: AC
  Filled 2018-01-19: qty 60

## 2018-01-19 MED ORDER — GABAPENTIN 300 MG PO CAPS: 300.0000 mg | ORAL_CAPSULE | Freq: Every day | ORAL | Status: DC

## 2018-01-19 MED ORDER — SUGAMMADEX SODIUM 500 MG/5ML IV SOLN
INTRAVENOUS | Status: DC | PRN
  Administered 2018-01-19: 300 mg via INTRAVENOUS

## 2018-01-19 MED ORDER — ROCURONIUM BROMIDE 10 MG/ML (PF) SYRINGE
PREFILLED_SYRINGE | INTRAVENOUS | Status: DC | PRN
  Administered 2018-01-19: 50 mg via INTRAVENOUS
  Administered 2018-01-19: 10 mg via INTRAVENOUS

## 2018-01-19 MED ORDER — HYDROCORTISONE 2.5 % RE CREA: 1.0000 "application " | TOPICAL_CREAM | Freq: Four times a day (QID) | RECTAL | Status: DC | PRN

## 2018-01-19 MED ORDER — LIDOCAINE 2% (20 MG/ML) 5 ML SYRINGE
INTRAMUSCULAR | Status: AC
  Filled 2018-01-19: qty 5

## 2018-01-19 MED ORDER — SODIUM CHLORIDE 0.9% FLUSH: 3.0000 mL | Freq: Two times a day (BID) | INTRAVENOUS | Status: DC

## 2018-01-19 MED ORDER — LACTATED RINGERS IV BOLUS: 1000.0000 mL | Freq: Three times a day (TID) | INTRAVENOUS | Status: DC | PRN

## 2018-01-19 MED ORDER — FENTANYL CITRATE (PF) 250 MCG/5ML IJ SOLN
INTRAMUSCULAR | Status: AC
  Filled 2018-01-19: qty 5

## 2018-01-19 MED ORDER — CHLORHEXIDINE GLUCONATE CLOTH 2 % EX PADS
6.0000 | MEDICATED_PAD | Freq: Once | CUTANEOUS | Status: AC
  Administered 2018-01-19: 6 via TOPICAL

## 2018-01-19 MED ORDER — LACTATED RINGERS IV SOLN
INTRAVENOUS | Status: DC
  Administered 2018-01-19: 11:00:00 via INTRAVENOUS

## 2018-01-19 MED ORDER — IOPAMIDOL (ISOVUE-300) INJECTION 61%
INTRAVENOUS | Status: AC
  Filled 2018-01-19: qty 50

## 2018-01-19 MED ORDER — PROMETHAZINE HCL 25 MG/ML IJ SOLN: 6.2500 mg | INTRAMUSCULAR | Status: DC | PRN

## 2018-01-19 MED ORDER — LACTATED RINGERS IR SOLN
Status: DC | PRN
  Administered 2018-01-19: 1000 mL

## 2018-01-19 MED ORDER — MIDAZOLAM HCL 5 MG/5ML IJ SOLN
INTRAMUSCULAR | Status: DC | PRN
  Administered 2018-01-19: 2 mg via INTRAVENOUS

## 2018-01-19 MED ORDER — OXYCODONE HCL 5 MG PO TABS: 5.0000 mg | ORAL_TABLET | ORAL | 0 refills | Status: DC | PRN

## 2018-01-19 MED ORDER — SCOPOLAMINE 1 MG/3DAYS TD PT72
MEDICATED_PATCH | TRANSDERMAL | Status: AC
  Administered 2018-01-19: 1.5 mg via TRANSDERMAL
  Filled 2018-01-19: qty 1

## 2018-01-19 MED ORDER — PHENOL 1.4 % MT LIQD: 1.0000 | OROMUCOSAL | Status: DC | PRN

## 2018-01-19 MED ORDER — KETAMINE HCL 10 MG/ML IJ SOLN
INTRAMUSCULAR | Status: DC | PRN
  Administered 2018-01-19: 30 mg via INTRAVENOUS

## 2018-01-19 MED ORDER — PROPOFOL 10 MG/ML IV BOLUS
INTRAVENOUS | Status: DC | PRN
  Administered 2018-01-19: 200 mg via INTRAVENOUS

## 2018-01-19 MED ORDER — SCOPOLAMINE 1 MG/3DAYS TD PT72
1.0000 | MEDICATED_PATCH | TRANSDERMAL | Status: DC
  Administered 2018-01-19: 1.5 mg via TRANSDERMAL

## 2018-01-19 MED ORDER — LIDOCAINE HCL (CARDIAC) PF 100 MG/5ML IV SOSY
PREFILLED_SYRINGE | INTRAVENOUS | Status: DC | PRN
  Administered 2018-01-19: 50 mg via INTRAVENOUS

## 2018-01-19 MED ORDER — DEXAMETHASONE SODIUM PHOSPHATE 4 MG/ML IJ SOLN: 4.0000 mg | INTRAMUSCULAR | Status: DC

## 2018-01-19 MED ORDER — PROPOFOL 10 MG/ML IV BOLUS
INTRAVENOUS | Status: AC
  Filled 2018-01-19: qty 20

## 2018-01-19 MED ORDER — BUPIVACAINE LIPOSOME 1.3 % IJ SUSP
20.0000 mL | INTRAMUSCULAR | Status: DC
  Filled 2018-01-19: qty 20

## 2018-01-19 MED ORDER — FENTANYL CITRATE (PF) 100 MCG/2ML IJ SOLN
INTRAMUSCULAR | Status: DC | PRN
  Administered 2018-01-19: 100 ug via INTRAVENOUS
  Administered 2018-01-19 (×3): 50 ug via INTRAVENOUS

## 2018-01-19 MED ORDER — LIDOCAINE 2% (20 MG/ML) 5 ML SYRINGE
INTRAMUSCULAR | Status: DC | PRN
  Administered 2018-01-19: 1.5 mg/kg/h via INTRAVENOUS

## 2018-01-19 MED ORDER — LIP MEDEX EX OINT: 1.0000 "application " | TOPICAL_OINTMENT | Freq: Two times a day (BID) | CUTANEOUS | Status: DC

## 2018-01-19 MED ORDER — ONDANSETRON HCL 4 MG/2ML IJ SOLN
INTRAMUSCULAR | Status: AC
  Filled 2018-01-19: qty 2

## 2018-01-19 MED ORDER — ACETAMINOPHEN 500 MG PO TABS: 1000.0000 mg | ORAL_TABLET | ORAL | Status: DC

## 2018-01-19 MED ORDER — SUGAMMADEX SODIUM 500 MG/5ML IV SOLN
INTRAVENOUS | Status: AC
  Filled 2018-01-19: qty 5

## 2018-01-19 MED ORDER — OXYCODONE HCL 5 MG PO TABS
5.0000 mg | ORAL_TABLET | ORAL | Status: DC | PRN
  Administered 2018-01-19: 5 mg via ORAL
  Filled 2018-01-19: qty 1

## 2018-01-19 MED ORDER — MIDAZOLAM HCL 2 MG/2ML IJ SOLN
INTRAMUSCULAR | Status: AC
  Filled 2018-01-19: qty 2

## 2018-01-19 MED ORDER — GUAIFENESIN-DM 100-10 MG/5ML PO SYRP: 10.0000 mL | ORAL_SOLUTION | ORAL | Status: DC | PRN

## 2018-01-19 MED ORDER — GABAPENTIN 300 MG PO CAPS
300.0000 mg | ORAL_CAPSULE | ORAL | Status: AC
  Administered 2018-01-19: 300 mg via ORAL

## 2018-01-19 MED ORDER — DEXAMETHASONE SODIUM PHOSPHATE 10 MG/ML IJ SOLN
INTRAMUSCULAR | Status: DC | PRN
  Administered 2018-01-19: 10 mg via INTRAVENOUS

## 2018-01-19 MED ORDER — MENTHOL 3 MG MT LOZG: 1.0000 | LOZENGE | OROMUCOSAL | Status: DC | PRN

## 2018-01-19 MED ORDER — ALUM & MAG HYDROXIDE-SIMETH 200-200-20 MG/5ML PO SUSP: 30.0000 mL | Freq: Four times a day (QID) | ORAL | Status: DC | PRN

## 2018-01-19 MED ORDER — SODIUM CHLORIDE 0.9 % IV SOLN: 250.0000 mL | INTRAVENOUS | Status: DC | PRN

## 2018-01-19 SURGICAL SUPPLY — 52 items
APPLIER CLIP 5 13 M/L LIGAMAX5 (MISCELLANEOUS) ×4
APPLIER CLIP ROT 10 11.4 M/L (STAPLE)
APR CLP MED LRG 11.4X10 (STAPLE)
APR CLP MED LRG 5 ANG JAW (MISCELLANEOUS) ×2
BAG SPEC RTRVL 10 TROC 200 (ENDOMECHANICALS) ×2
CABLE HIGH FREQUENCY MONO STRZ (ELECTRODE) ×4 IMPLANT
CHLORAPREP W/TINT 26ML (MISCELLANEOUS) ×4 IMPLANT
CLIP APPLIE 5 13 M/L LIGAMAX5 (MISCELLANEOUS) ×1 IMPLANT
CLIP APPLIE ROT 10 11.4 M/L (STAPLE) IMPLANT
COVER MAYO STAND STRL (DRAPES) ×3 IMPLANT
COVER SURGICAL LIGHT HANDLE (MISCELLANEOUS) ×4 IMPLANT
DECANTER SPIKE VIAL GLASS SM (MISCELLANEOUS) ×4 IMPLANT
DRAPE C-ARM 42X120 X-RAY (DRAPES) ×3 IMPLANT
DRAPE UTILITY XL STRL (DRAPES) ×4 IMPLANT
DRAPE WARM FLUID 44X44 (DRAPE) ×4 IMPLANT
DRSG TEGADERM 2-3/8X2-3/4 SM (GAUZE/BANDAGES/DRESSINGS) ×2 IMPLANT
DRSG TEGADERM 4X4.75 (GAUZE/BANDAGES/DRESSINGS) ×4 IMPLANT
ELECT REM PT RETURN 15FT ADLT (MISCELLANEOUS) ×4 IMPLANT
ENDOLOOP SUT PDS II  0 18 (SUTURE) ×2
ENDOLOOP SUT PDS II 0 18 (SUTURE) ×1 IMPLANT
GAUZE SPONGE 2X2 8PLY STRL LF (GAUZE/BANDAGES/DRESSINGS) ×2 IMPLANT
GLOVE BIO SURGEON STRL SZ7 (GLOVE) ×3 IMPLANT
GLOVE BIOGEL PI IND STRL 7.0 (GLOVE) ×3 IMPLANT
GLOVE BIOGEL PI IND STRL 7.5 (GLOVE) ×1 IMPLANT
GLOVE BIOGEL PI INDICATOR 7.0 (GLOVE) ×6
GLOVE BIOGEL PI INDICATOR 7.5 (GLOVE) ×2
GLOVE ECLIPSE 8.0 STRL XLNG CF (GLOVE) ×4 IMPLANT
GLOVE INDICATOR 8.0 STRL GRN (GLOVE) ×4 IMPLANT
GLOVE SURG SS PI 7.0 STRL IVOR (GLOVE) ×4 IMPLANT
GOWN STRL REUS W/ TWL LRG LVL3 (GOWN DISPOSABLE) ×1 IMPLANT
GOWN STRL REUS W/TWL LRG LVL3 (GOWN DISPOSABLE) ×4
GOWN STRL REUS W/TWL XL LVL3 (GOWN DISPOSABLE) ×12 IMPLANT
IRRIG SUCT STRYKERFLOW 2 WTIP (MISCELLANEOUS) ×4
IRRIGATION SUCT STRKRFLW 2 WTP (MISCELLANEOUS) ×2 IMPLANT
KIT BASIN OR (CUSTOM PROCEDURE TRAY) ×4 IMPLANT
PAD POSITIONING PINK XL (MISCELLANEOUS) ×4 IMPLANT
POSITIONER SURGICAL ARM (MISCELLANEOUS) ×4 IMPLANT
POUCH RETRIEVAL ECOSAC 10 (ENDOMECHANICALS) ×1 IMPLANT
POUCH RETRIEVAL ECOSAC 10MM (ENDOMECHANICALS) ×2
SCISSORS LAP 5X35 DISP (ENDOMECHANICALS) ×4 IMPLANT
SET CHOLANGIOGRAPH MIX (MISCELLANEOUS) ×3 IMPLANT
SHEARS HARMONIC ACE PLUS 45CM (MISCELLANEOUS) ×4 IMPLANT
SLEEVE XCEL OPT CAN 5 100 (ENDOMECHANICALS) ×5 IMPLANT
SPONGE GAUZE 2X2 STER 10/PKG (GAUZE/BANDAGES/DRESSINGS) ×2
SUT MNCRL AB 4-0 PS2 18 (SUTURE) ×4 IMPLANT
SUT PDS AB 1 CT1 27 (SUTURE) ×9 IMPLANT
TOWEL OR 17X26 10 PK STRL BLUE (TOWEL DISPOSABLE) ×4 IMPLANT
TOWEL OR NON WOVEN STRL DISP B (DISPOSABLE) ×4 IMPLANT
TRAY LAPAROSCOPIC (CUSTOM PROCEDURE TRAY) ×4 IMPLANT
TROCAR BLADELESS OPT 5 100 (ENDOMECHANICALS) ×1 IMPLANT
TROCAR BLADELESS OPT 5 150 (ENDOMECHANICALS) ×4 IMPLANT
TROCAR XCEL NON-BLD 11X100MML (ENDOMECHANICALS) ×4 IMPLANT

## 2018-01-19 NOTE — Anesthesia Preprocedure Evaluation (Addendum)
Anesthesia Evaluation  Patient identified by MRN, date of birth, ID band Patient awake    Reviewed: Allergy & Precautions, NPO status , Patient's Chart, lab work & pertinent test results  Airway Mallampati: II  TM Distance: >3 FB Neck ROM: Full    Dental no notable dental hx.    Pulmonary asthma ,    Pulmonary exam normal breath sounds clear to auscultation       Cardiovascular negative cardio ROS Normal cardiovascular exam Rhythm:Regular Rate:Normal     Neuro/Psych Anxiety negative neurological ROS     GI/Hepatic negative GI ROS, Neg liver ROS,   Endo/Other  Morbid obesityPCOS (polycystic ovarian syndrome)  Renal/GU negative Renal ROS     Musculoskeletal negative musculoskeletal ROS (+)   Abdominal   Peds  Hematology negative hematology ROS (+)   Anesthesia Other Findings symptomatic cholelithiaisis  Reproductive/Obstetrics                            Anesthesia Physical Anesthesia Plan  ASA: II  Anesthesia Plan: General   Post-op Pain Management:    Induction: Intravenous  PONV Risk Score and Plan: 4 or greater and Scopolamine patch - Pre-op, Midazolam, Dexamethasone, Ondansetron and Treatment may vary due to age or medical condition  Airway Management Planned: Oral ETT  Additional Equipment:   Intra-op Plan:   Post-operative Plan: Extubation in OR  Informed Consent: I have reviewed the patients History and Physical, chart, labs and discussed the procedure including the risks, benefits and alternatives for the proposed anesthesia with the patient or authorized representative who has indicated his/her understanding and acceptance.   Dental advisory given  Plan Discussed with: CRNA  Anesthesia Plan Comments:         Anesthesia Quick Evaluation

## 2018-01-19 NOTE — Op Note (Signed)
01/19/2018  PATIENT:  Kristi Ewing  22 y.o. female  Patient Care Team: [name], MD as PCP - General  PRE-OPERATIVE DIAGNOSIS:    Acute on Chronic Calculus Cholecystitis  POST-OPERATIVE DIAGNOSIS:   Acute on Chronic Calculus Cholecystitis Liver: Fatty steatohepatitis  PROCEDURE:  SINGLE SITE Laparoscopic cholecystectomy with intraoperative cholangiogram  SURGEON:  Adin Hector, MD, FACS.  ASSISTANT: Earnstine Regal, PA-C   ANESTHESIA:    General with endotracheal intubation Local anesthetic as a field block  EBL:  (See Anesthesia Intraoperative Record) Total I/O In: 619.9 [I.V.:619.9] Out: 10 [Blood:10]  Delay start of Pharmacological VTE agent (>24hrs) due to surgical blood loss or risk of bleeding:  no  DRAINS: None   SPECIMEN: Gallbladder    DISPOSITION OF SPECIMEN:  PATHOLOGY  COUNTS:  YES  PLAN OF CARE: Discharge to home after PACU  PATIENT DISPOSITION:  PACU - hemodynamically stable.  INDICATION: Pleasant young female with episode of abdominal pain and nausea vomiting suspicious for biliary colic.  Gallstones confirmed.  Diagnosis seems unlikely.  Pain persistent, concerning for cholecystitis.  Underwent admission, antibiotics.  Was conditioning for cholecystectomy  The anatomy & physiology of hepatobiliary & pancreatic function was discussed.  The pathophysiology of gallbladder dysfunction was discussed.  Natural history risks without surgery was discussed.   I feel the risks of no intervention will lead to serious problems that outweigh the operative risks; therefore, I recommended cholecystectomy to remove the pathology.  I explained laparoscopic techniques with possible need for an open approach.  Probable cholangiogram to evaluate the bilary tract was explained as well.    Risks such as bleeding, infection, abscess, leak, injury to other organs, need for further treatment, heart attack, death, and other risks were discussed.  I noted a  good likelihood this will help address the problem.  Possibility that this will not correct all abdominal symptoms was explained.  Goals of post-operative recovery were discussed as well.  We will work to minimize complications.  An educational handout further explaining the pathology and treatment options was given as well.  Questions were answered.  The patient expresses understanding & wishes to proceed with surgery.  OR FINDINGS: Distended gallbladder with some early edema consistent with acute on chronic cholecystitis.  A few moderately large 8-36m gallstones.    Cholangiogram showing plastic biliary anatomy.  Wisp of contrast into the duodenum across the ampulla argues against any complete CBD obstruction  Liver: Fatty steatohepatitis  DESCRIPTION:   The patient was identified & brought in the operating room. The patient was positioned supine with arms tucked. SCDs were active during the entire case. The patient underwent general anesthesia without any difficulty.  The abdomen was prepped and draped in a sterile fashion. A Surgical Timeout confirmed our plan.  I made a transverse curvilinear incision through the superior umbilical fold.  I placed a 547mlong port through the supraumbilical fascia using a modified Hassan cutdown technique with umbilical stalk fascial countertraction. I began carbon dioxide insufflation.  No change in end tidal CO2 measurement.   Camera inspection revealed no injury. There were no adhesions to the anterior abdominal wall supraumbilically.  I proceeded to continue with single site technique. I placed a #5 port in left upper aspect of the wound. I placed a 5 mm atraumatic grasper in the right inferior aspect of the wound.  I turned attention to the right upper quadrant.  Gallbladder was distended with some edema and chronic adhesions consistent with acute on chronic cholecystitis.  While it was very distended I was able to actually grasp the fundus and elevate it  cephalad. I freed adhesions of greater omentum to the ventral surface off of the gallbladder off carefully.  I freed the peritoneal coverings between the gallbladder and the liver on the posteriolateral and anteriomedial walls. I alternated between Harmonic & blunt Maryland dissection to help get a good critical view of the cystic artery and cystic duct.  did further dissection to free all of the gallbladder off the liver bed to get a good critical view of the infundibulum and cystic duct.  Modified dome down technique.  I dissected out the cystic artery; and, after getting a good 360 view, ligated the anterior & posterior branches of the cystic artery close on the infundibulum using the Harmonic ultrasonic dissection.  I skeletonized the cystic duct.  I placed a clip on the infundibulum. I did a partial cystic duct-otomy and ensured patency. I placed a 5 [place] cholangiocatheter through a puncture site at the right subcostal ridge of the abdominal wall and directed it into the cystic duct.  We ran a cholangiogram with dilute radio-opaque contrast and continuous fluoroscopy. Contrast flowed from a side branch consistent with cystic duct cannulization. Contrast flowed up the common hepatic duct into the right and left intrahepatic chains out to secondary radicals. Contrast flowed down the common bile duct easily.  There were triply wisps of contrast going across the ampulla into the duodenum, although it was hard to tell how much was residing in there and she is obese with dilute contrast.  I ran cholangiogram again to confirm that was contrast flowing across the ampulla into the duodenum, arguing against any obstruction or choledocholithiasis.   I removed the cholangiocatheter.  Ligated the cystic duct with a 0 PDS Endoloop going around the entire gallbladder down to the base and likely closer to the common bile duct.  I placed clips on the cystic duct x4.  I completed cystic duct transection.  Because of  gallbladder inflammation, I did place in an eco-sac bag.  I inspected the rest of the abdomen & detected no injury nor bleeding elsewhere.  I removed the gallbladder out the supraumbilical fascia.  I did have to open the fascia to a 2 cm defect with large stones out I closed the fascia transversely using  #1 PDS interrupted stitches. I closed the skin using 4-0 monocryl stitch.  Sterile dressing was applied. The patient was extubated & arrived in the PACU in stable condition..  I had discussed postoperative care with the patient in the holding area. I discussed operative findings, updated the patient's status, discussed probable steps to recovery, and gave postoperative recommendations to the patient's parents.  Recommendations were made.  Questions were answered.  They expressed understanding & appreciation.  Adin Hector, M.D., F.A.C.S. Gastrointestinal and Minimally Invasive Surgery [facility] [address] [place], [place] [id number] [phone] Main / Paging  01/19/2018 1:07 PM

## 2018-01-19 NOTE — Progress Notes (Signed)
[facility] Progress Note  Day of Surgery  Subjective: CC-  Feeling better than yesterday, but still has some upper abdominal discomfort requiring pain medication. Denies n/v. Slept well last night.  Objective: Vital signs in last 24 hours: Temp:  [98.1 F (36.7 C)-98.2 F (36.8 C)] 98.2 F (36.8 C) (08/28 0547) Pulse Rate:  [48-76] 64 (08/28 0547) Resp:  [16-18] 17 (08/28 0547) BP: (89-129)/(32-73) 129/64 (08/28 0547) SpO2:  [97 %-100 %] 99 % (08/28 0547) Last BM Date: 01/18/18  Intake/Output from previous day: 08/27 0701 - 08/28 0700 In: 1342.4 [I.V.:1342.4] Out: -  Intake/Output this shift: No intake/output data recorded.  PE: Gen:  Alert, NAD, pleasant HEENT: EOM's intact, pupils equal and round Pulm:  effort normal Abd: Soft, ND, mild TTP epigastric region, +BS Ext:  Calves soft and nontender Psych: A&Ox3  Skin: no rashes noted, warm and dry  Lab Results:  Recent Labs    01/18/18 0700 01/19/18 0345  WBC 12.8* 7.1  HGB 13.0 12.1  HCT 39.8 37.8  PLT 445* 365   BMET Recent Labs    01/18/18 0700 01/19/18 0345  NA 141 141  K 4.1 4.2  CL 105 107  CO2 24 25  GLUCOSE 114* 86  BUN 14 9  CREATININE 0.74 0.71  CALCIUM 9.7 9.0   PT/INR No results for input(s): LABPROT, INR in the last 72 hours. CMP     Component Value Date/Time   NA 141 01/19/2018 0345   K 4.2 01/19/2018 0345   CL 107 01/19/2018 0345   CO2 25 01/19/2018 0345   GLUCOSE 86 01/19/2018 0345   BUN 9 01/19/2018 0345   CREATININE 0.71 01/19/2018 0345   CALCIUM 9.0 01/19/2018 0345   PROT 6.7 01/19/2018 0345   ALBUMIN 3.7 01/19/2018 0345   AST 50 (H) 01/19/2018 0345   ALT 62 (H) 01/19/2018 0345   ALKPHOS 63 01/19/2018 0345   BILITOT 0.6 01/19/2018 0345   GFRNONAA >60 01/19/2018 0345   GFRAA >60 01/19/2018 0345   Lipase     Component Value Date/Time   LIPASE 46 01/18/2018 0700       Studies/Results: Ct Abdomen Pelvis W Contrast  Result Date:  01/18/2018 CLINICAL DATA:  Abdominal pain that is acute in generalized EXAM: CT ABDOMEN AND PELVIS WITH CONTRAST TECHNIQUE: Multidetector CT imaging of the abdomen and pelvis was performed using the standard protocol following bolus administration of intravenous contrast. CONTRAST:  134m ISOVUE-300 IOPAMIDOL (ISOVUE-300) INJECTION 61% COMPARISON:  11/19/2013 FINDINGS: Lower chest:  No contributory findings. Hepatobiliary: Hepatic steatosis and cholelithiasis.No acute finding. Pancreas: Unremarkable. Spleen: Unremarkable. Adrenals/Urinary Tract: Negative adrenals. No hydronephrosis or stone. Unremarkable bladder. Stomach/Bowel:  No obstruction. No appendicitis. Vascular/Lymphatic: No acute vascular abnormality. No mass or adenopathy. Reproductive:No pathologic findings. Other: No ascites or pneumoperitoneum. Small area of fat reticulation in the ventral abdomen, see 2:50, stable from 2015 and likely from remote inflammatory process such as fat infarct. Musculoskeletal: No acute abnormalities. L2-3 small disc protrusion with buttressing osteophyte. IMPRESSION: 1. No acute finding. 2. Cholelithiasis and hepatic steatosis. Electronically Signed   By: JMonte FantasiaM.D.   On: 01/18/2018 08:44   UKoreaAbdomen Limited  Result Date: 01/18/2018 CLINICAL DATA:  22y/o  F; epigastric pain. EXAM: ULTRASOUND ABDOMEN LIMITED RIGHT UPPER QUADRANT COMPARISON:  10/04/2016 renal ultrasound. FINDINGS: Gallbladder: Cholelithiasis. No gallbladder wall thickening. No pericholecystic fluid. Negative sonographic Murphy's sign. Common bile duct: Diameter: 3 mm Liver: No focal liver lesion identified. Increased liver echogenicity. Portal vein is patent  on color Doppler imaging with normal direction of blood flow towards the liver. IMPRESSION: 1. Cholelithiasis.  No secondary signs of acute cholecystitis. 2. Hepatic steatosis. Electronically Signed   By: Kristine Garbe M.D.   On: 01/18/2018 06:39     Anti-infectives: Anti-infectives (From admission, onward)   Start     Dose/Rate Route Frequency Ordered Stop   01/19/18 0600  cefTRIAXone (ROCEPHIN) 2 g in sodium chloride 0.9 % 100 mL IVPB     2 g 200 mL/hr over 30 Minutes Intravenous On call to O.R. 01/18/18 1558 01/20/18 0559       Assessment/Plan Anxiety - home meds Obesity  H/o kidney stones  Symptomatic cholelithiasis - Laparoscopic cholecystectomy today. Keep NPO. Patient hoping to go home today after surgery.  ID - rocephin on call to OR FEN - IVF, NPO VTE - SCDs Foley - none   LOS: 0 days    Wellington [place] , [facility] Surgery 01/19/2018, 9:42 AM Pager: [phone] Consults: [phone] Mon 7:00 am -11:30 AM Tues-Fri 7:00 am-4:30 pm Sat-Sun 7:00 am-11:30 am

## 2018-01-19 NOTE — Discharge Instructions (Addendum)
Please arrive at least 30 min before your appointment to complete your check in paperwork.  If you are unable to arrive 30 min prior to your appointment time we may have to cancel or reschedule you.  LAPAROSCOPIC SURGERY: POST OP INSTRUCTIONS  ######################################################################  EAT Gradually transition to a high fiber diet with a fiber supplement over the next few weeks after discharge.  Start with a pureed / full liquid diet (see below)  WALK Walk an hour a day.  Control your pain to do that.    CONTROL PAIN Control pain so that you can walk, sleep, tolerate sneezing/coughing, go up/down stairs.  HAVE A BOWEL MOVEMENT DAILY Keep your bowels regular to avoid problems.  OK to try a laxative to override constipation.  OK to use an antidairrheal to slow down diarrhea.  Call if not better after 2 tries  CALL IF YOU HAVE PROBLEMS/CONCERNS Call if you are still struggling despite following these instructions. Call if you have concerns not answered by these instructions  ######################################################################    1. DIET: Follow a light bland diet the first 24 hours after arrival home, such as soup, liquids, crackers, etc.  Be sure to include lots of fluids daily.  Avoid fast food or heavy meals as your are more likely to get nauseated.  Eat a low fat the next few days after surgery.   2. Take your usually prescribed home medications unless otherwise directed. 3. PAIN CONTROL: a. Pain is best controlled by a usual combination of three different methods TOGETHER: i. Ice/Heat ii. Over the counter pain medication iii. Prescription pain medication b. Most patients will experience some swelling and bruising around the incisions.  Ice packs or heating pads (30-60 minutes up to 6 times a day) will help. Use ice for the first few days to help decrease swelling and bruising, then switch to heat to help relax tight/sore spots and  speed recovery.  Some people prefer to use ice alone, heat alone, alternating between ice & heat.  Experiment to what works for you.  Swelling and bruising can take several weeks to resolve.   c. It is helpful to take an over-the-counter pain medication regularly for the first few weeks.  Choose one of the following that works best for you: i. Naproxen (Aleve, etc)  Two 242m tabs twice a day ii. Ibuprofen (Advil, etc) Three 203mtabs four times a day (every meal & bedtime) iii. Acetaminophen (Tylenol, etc) 500-65075mour times a day (every meal & bedtime) d. A  prescription for pain medication (such as oxycodone, hydrocodone, etc) should be given to you upon discharge.  Take your pain medication as prescribed.  i. If you are having problems/concerns with the prescription medicine (does not control pain, nausea, vomiting, rash, itching, etc), please call us [phone] see if we need to switch you to a different pain medicine that will work better for you and/or control your side effect better. ii. If you need a refill on your pain medication, please contact your pharmacy.  They will contact our office to request authorization. Prescriptions will not be filled after 5 pm or on week-ends. 4. Avoid getting constipated.  Between the surgery and the pain medications, it is common to experience some constipation.  Increasing fluid intake and taking a fiber supplement (such as Metamucil, Citrucel, FiberCon, MiraLax, etc) 1-2 times a day regularly will usually help prevent this problem from occurring.  A mild laxative (prune juice, Milk of Magnesia, MiraLax, etc) should be  taken according to package directions if there are no bowel movements after 48 hours.   5. Watch out for diarrhea.  If you have many loose bowel movements, simplify your diet to bland foods & liquids for a few days.  Stop any stool softeners and decrease your fiber supplement.  Switching to mild anti-diarrheal medications (Kayopectate,  Pepto Bismol) can help.  If this worsens or does not improve, please call us. 6. Wash / shower every day.  You may shower over the dressings as they are waterproof.  Continue to shower over incision(s) after the dressing is off. 7. Remove your waterproof bandages 5 days after surgery.  You may leave the incision open to air.  You may replace a dressing/Band-Aid to cover the incision for comfort if you wish.  8. ACTIVITIES as tolerated:   a. You may resume regular (light) daily activities beginning the next day--such as daily self-care, walking, climbing stairs--gradually increasing activities as tolerated.  If you can walk 30 minutes without difficulty, it is safe to try more intense activity such as jogging, treadmill, bicycling, low-impact aerobics, swimming, etc. b. Save the most intensive and strenuous activity for last such as sit-ups, heavy lifting, contact sports, etc  Refrain from any heavy lifting or straining until you are off narcotics for pain control.   c. DO NOT PUSH THROUGH PAIN.  Let pain be your guide: If it hurts to do something, don't do it.  Pain is your body warning you to avoid that activity for another week until the pain goes down. d. You may drive when you are no longer taking prescription pain medication, you can comfortably wear a seatbelt, and you can safely maneuver your car and apply brakes. e. Dennis Bast may have sexual intercourse when it is comfortable.  9. FOLLOW UP in our office a. Please call [facility] at [phone] to set up an appointment to see your surgeon in the office for a follow-up appointment approximately 2-3 weeks after your surgery. b. Make sure that you call for this appointment the day you arrive home to insure a convenient appointment time. 10. IF YOU HAVE DISABILITY OR FAMILY LEAVE FORMS, BRING THEM TO THE OFFICE FOR PROCESSING.  DO NOT GIVE THEM TO YOUR DOCTOR.   WHEN TO CALL us [phone]: 1. Poor pain control 2. Reactions / problems with new  medications (rash/itching, nausea, etc)  3. Fever over 101.5 F (38.5 C) 4. Inability to urinate 5. Nausea and/or vomiting 6. Worsening swelling or bruising 7. Continued bleeding from incision. 8. Increased pain, redness, or drainage from the incision   The clinic staff is available to answer your questions during regular business hours (8:30am-5pm).  Please dont hesitate to call and ask to speak to one of our nurses for clinical concerns.   If you have a medical emergency, go to the nearest emergency room or call 911.  A surgeon from [facility] is always on call at the [facility], [place], [place], [place], [place]  [address] ? MAIN: [phone] ? TOLL FREE: [phone] ?  FAX [phone] [email or web address]     Cholecystitis Cholecystitis is inflammation of the gallbladder. It is often called a gallbladder attack. The gallbladder is a pear-shaped organ that lies beneath the liver on the right side of the body. The gallbladder stores bile, which is a fluid that helps the body to digest fats. If bile builds up in your gallbladder, your gallbladder becomes inflamed.  This condition may occur suddenly (be acute). Repeat episodes of acute cholecystitis or prolonged episodes may lead to a long-term (chronic) condition. Cholecystitis is serious and it requires treatment. What are the causes? The most common cause of this condition is gallstones. Gallstones can block the tube (duct) that carries bile out of your gallbladder. This causes bile to build up. Other causes of this condition include:  Damage to the gallbladder due to a decrease in blood flow.  Infections in the bile ducts.  Scars or kinks in the bile ducts.  Tumors in the liver, pancreas, or gallbladder.  What increases the risk? This condition is more likely to develop in:  People who have sickle cell disease.  People who take birth control pills  or use estrogen.  People who have alcoholic liver disease.  People who have liver cirrhosis.  People who have their nutrition delivered through a vein (parenteral nutrition).  People who do not eat or drink (do fasting) for a long period of time.  People who are obese.  People who have rapid weight loss.  People who are pregnant.  People who have increased triglyceride levels.  People who have pancreatitis.  What are the signs or symptoms? Symptoms of this condition include:  Abdominal pain, especially in the upper right area of the abdomen.  Abdominal tenderness or bloating.  Nausea.  Vomiting.  Fever.  Chills.  Yellowing of the skin and the whites of the eyes (jaundice).  How is this diagnosed? This condition is diagnosed with a medical history and physical exam. You may also have other tests, including:  Imaging tests, such as: ? An ultrasound of the gallbladder. ? A CT scan of the abdomen. ? A gallbladder nuclear scan (HIDA scan). This scan allows your health care provider to see the bile moving from your liver to your gallbladder and to your small intestine. ? MRI.  Blood tests, such as: ? A complete blood count, because the white blood cell count may be higher than normal. ? Liver function tests, because some levels may be higher than normal with certain types of gallstones.  How is this treated? Treatment may include:  Fasting for a certain amount of time.  IV fluids.  Medicine to treat pain or vomiting.  Antibiotic medicine.  Surgery to remove your gallbladder (cholecystectomy). This may happen immediately or at a later time.  Follow these instructions at home: Home care will depend on your treatment. In general:  Take over-the-counter and prescription medicines only as told by your health care provider.  If you were prescribed an antibiotic medicine, take it as told by your health care provider. Do not stop taking the antibiotic even if you  start to feel better.  Follow instructions from your health care provider about what to eat or drink. When you are allowed to eat, avoid eating or drinking anything that triggers your symptoms.  Keep all follow-up visits as told by your health care provider. This is important.  Contact a health care provider if:  Your pain is not controlled with medicine.  You have a fever. Get help right away if:  Your pain moves to another part of your abdomen or to your back.  You continue to have symptoms or you develop new symptoms even with treatment. This information is not intended to replace advice given to you by your health care provider. Make sure you discuss any questions you have with your health care provider. Document Released: 05/11/2005 Document Revised: 09/19/2015 Document  Reviewed: 08/22/2014 Elsevier Interactive Patient Education  [employer].

## 2018-01-19 NOTE — Progress Notes (Signed)
Discharge instructions discussed with patient and family, verbalized agreement and understanding 

## 2018-01-19 NOTE — Anesthesia Procedure Notes (Signed)
Procedure Name: Intubation Date/Time: 01/19/2018 11:35 AM Performed by: Thornell MuleStubblefield, Angelica Wix G, CRNA Pre-anesthesia Checklist: Patient identified, Emergency Drugs available, Suction available and Patient being monitored Patient Re-evaluated:Patient Re-evaluated prior to induction Oxygen Delivery Method: Circle system utilized Preoxygenation: Pre-oxygenation with 100% oxygen Induction Type: IV induction Ventilation: Mask ventilation without difficulty Laryngoscope Size: Miller and 3 Grade View: Grade I Tube type: Oral Tube size: 7.0 mm Number of attempts: 1 Airway Equipment and Method: Stylet and Oral airway Placement Confirmation: ETT inserted through vocal cords under direct vision,  positive ETCO2 and breath sounds checked- equal and bilateral Secured at: 20 cm Tube secured with: Tape Dental Injury: Teeth and Oropharynx as per pre-operative assessment

## 2018-01-19 NOTE — Anesthesia Postprocedure Evaluation (Signed)
Anesthesia Post Note  Patient: Kristi Ewing  Procedure(s) Performed: LAPAROSCOPIC CHOLECYSTECTOMY SINGLE SITE WITH INTRAOPERATIVE CHOLANGIOGRAM (Abdomen)     Patient location during evaluation: PACU Anesthesia Type: General Level of consciousness: awake and alert Pain management: pain level controlled Vital Signs Assessment: post-procedure vital signs reviewed and stable Respiratory status: spontaneous breathing, nonlabored ventilation, respiratory function stable and patient connected to nasal cannula oxygen Cardiovascular status: blood pressure returned to baseline and stable Postop Assessment: no apparent nausea or vomiting Anesthetic complications: no    Last Vitals:  Vitals:   01/19/18 1400 01/19/18 1421  BP:  131/68  Pulse:  67  Resp:  16  Temp: 37.4 C 37 C  SpO2:  97%    Last Pain:  Vitals:   01/19/18 1508  TempSrc:   PainSc: 6                  Ryan P Ellender

## 2018-01-19 NOTE — Discharge Summary (Signed)
[facility] Discharge Summary   Patient ID: Kristi Ewing MRN: [id number] DOB/AGE: 12-07-1995 22 y.o.  Admit date: 01/18/2018 Discharge date: 01/19/2018  Admitting Diagnosis: Symptomatic cholelithiasis  Discharge Diagnosis Patient Active Problem List   Diagnosis Date Noted  . Acute cholecystitis with chronic cholecystitis 01/18/2018  . Morbid obesity with body mass index of 40.0-49.9 ([place]) 01/18/2018  . PCOS (polycystic ovarian syndrome) 12/24/2014  . Pyrexia 08/24/2014  . Anxiety state 10/15/2009    Consultants None  Imaging: Dg Cholangiogram Operative  Result Date: 01/19/2018 CLINICAL DATA:  22 year old female with a history of cholelithiasis EXAM: INTRAOPERATIVE CHOLANGIOGRAM TECHNIQUE: Cholangiographic images from the C-arm fluoroscopic device were submitted for interpretation post-operatively. Please see the procedural report for the amount of contrast and the fluoroscopy time utilized. COMPARISON:  CT 01/18/2018 FINDINGS: Surgical instruments project over the upper abdomen. There is cannulation of the cystic duct/gallbladder neck, with antegrade infusion of contrast. Caliber of the extrahepatic ductal system within normal limits. Contrast is not visualized to cross the ampulla. IMPRESSION: Intraoperative cholangiogram demonstrates extrahepatic biliary ducts of unremarkable caliber. Contrast was not visualized to cross the ampulla, and a distal common bile duct stone is not excluded. Please refer to the dictated operative report for full details of intraoperative findings and procedure Electronically Signed   By: Corrie Mckusick D.O.   On: 01/19/2018 12:59   Ct Abdomen Pelvis W Contrast  Result Date: 01/18/2018 CLINICAL DATA:  Abdominal pain that is acute in generalized EXAM: CT ABDOMEN AND PELVIS WITH CONTRAST TECHNIQUE: Multidetector CT imaging of the abdomen and pelvis was performed using the standard protocol following bolus administration of intravenous  contrast. CONTRAST:  124m ISOVUE-300 IOPAMIDOL (ISOVUE-300) INJECTION 61% COMPARISON:  11/19/2013 FINDINGS: Lower chest:  No contributory findings. Hepatobiliary: Hepatic steatosis and cholelithiasis.No acute finding. Pancreas: Unremarkable. Spleen: Unremarkable. Adrenals/Urinary Tract: Negative adrenals. No hydronephrosis or stone. Unremarkable bladder. Stomach/Bowel:  No obstruction. No appendicitis. Vascular/Lymphatic: No acute vascular abnormality. No mass or adenopathy. Reproductive:No pathologic findings. Other: No ascites or pneumoperitoneum. Small area of fat reticulation in the ventral abdomen, see 2:50, stable from 2015 and likely from remote inflammatory process such as fat infarct. Musculoskeletal: No acute abnormalities. L2-3 small disc protrusion with buttressing osteophyte. IMPRESSION: 1. No acute finding. 2. Cholelithiasis and hepatic steatosis. Electronically Signed   By: JMonte FantasiaM.D.   On: 01/18/2018 08:44   [place]Abdomen Limited  Result Date: 01/18/2018 CLINICAL DATA:  22y/o  F; epigastric pain. EXAM: ULTRASOUND ABDOMEN LIMITED RIGHT UPPER QUADRANT COMPARISON:  10/04/2016 renal ultrasound. FINDINGS: Gallbladder: Cholelithiasis. No gallbladder wall thickening. No pericholecystic fluid. Negative sonographic Murphy's sign. Common bile duct: Diameter: 3 mm Liver: No focal liver lesion identified. Increased liver echogenicity. Portal vein is patent on color Doppler imaging with normal direction of blood flow towards the liver. IMPRESSION: 1. Cholelithiasis.  No secondary signs of acute cholecystitis. 2. Hepatic steatosis. Electronically Signed   By: LKristine GarbeM.D.   On: 01/18/2018 06:39    Procedures Dr. GJohney Maine(01/19/18) - [facility]  Kristi EISCHEIDis a 22yofemale who [facility].  ED workup included CT abdomen/pelvis and an abdominal u/s, both which showed cholelithiasis  without signs of acute cholecystitis. WBC 12.8, lipase 46, AST 92, ALT 63, alk phos 64, bilirubin 0.6. Pain from symptomatic cholelithiasis was uncontrolled in the ED therefore patient was admitted and underwent procedure listed above.  Tolerated procedure well and was transferred to the floor.  Diet was advanced as tolerated.  On POD0 the patient was voiding well, tolerating diet, ambulating well, pain well controlled, vital signs stable, incisions c/d/i and felt stable for discharge home.  Patient will follow up as below and knows to call with questions or concerns.    I have personally reviewed the patients medication history on the [place] controlled substance database.     Allergies as of 01/19/2018      Reactions   Hydrocodone-homatropine    Rash/hives       Medication List    TAKE these medications   ALPRAZolam 0.5 MG tablet Commonly known as:  XANAX Take 1 tablet (0.5 mg total) by mouth 2 (two) times daily as needed for anxiety.   escitalopram 20 MG tablet Commonly known as:  LEXAPRO TAKE 1 TABLET BY MOUTH EVERY DAY   oxyCODONE 5 MG immediate release tablet Commonly known as:  Oxy IR/ROXICODONE Take 1 tablet (5 mg total) by mouth every 4 (four) hours as needed for severe pain or breakthrough pain.   PREVIFEM 0.25-35 MG-MCG tablet Generic drug:  norgestimate-ethinyl estradiol Take 1 tablet by mouth daily.            Discharge Care Instructions  (From admission, onward)         Start     Ordered   01/19/18 0000  Discharge wound care:    Comments:  It is good for closed incisions and even open wounds to be washed every day.  Shower every day.  Short baths are fine.  Wash the incisions and wounds clean with soap & water.     You may leave closed incisions open to air if it is dry.   You may cover the incision with clean gauze & replace it after your daily shower for comfort.   If you have skin tapes (Steristrips) or skin glue (Dermabond) on your incision, leave them in  place.  They will fall off on their own like a scab.  You may trim any edges that curl up with clean scissors.    If you have skin staples, set up an appointment for them to be removed in the office in 10 days after surgery.  If you have a drain, wash around the skin exit site with soap & water and place a new dressing of gauze or band aid around the skin every day.  Keep the drain site clean & dry.   01/19/18 1306           Follow-up [facility] Go on 02/01/2018.   Specialty:  General Surgery Why:  Your appointment is 09/10 at 11:45 am Please arrive 30 minutes prior to your appointment to check in and fill out paperwork. Bring photo ID and insurance information. Contact information: [address] [place] [phone]          Signed: Wellington Hampshire, [facility] 01/19/2018, 2:53 PM Pager: [phone] Consults: [phone] Mon 7:00 am -11:30 AM Tues-Fri 7:00 am-4:30 pm Sat-Sun 7:00 am-11:30 am

## 2018-01-19 NOTE — Transfer of Care (Signed)
Immediate Anesthesia Transfer of Care Note  Patient: Kristi Ewing  Procedure(s) Performed: LAPAROSCOPIC CHOLECYSTECTOMY SINGLE SITE WITH INTRAOPERATIVE CHOLANGIOGRAM (Abdomen)  Patient Location: PACU  Anesthesia Type:General  Level of Consciousness: awake, alert  and oriented  Airway & Oxygen Therapy: Patient Spontanous Breathing and Patient connected to face mask oxygen  Post-op Assessment: Report given to RN and Post -op Vital signs reviewed and stable  Post vital signs: Reviewed and stable  Last Vitals:  Vitals Value Taken Time  BP    Temp    Pulse    Resp    SpO2      Last Pain:  Vitals:   01/19/18 0936  TempSrc:   PainSc: 7       Patients Stated Pain Goal: 4 (01/19/18 0936)  Complications: No apparent anesthesia complications  Frenulum bar/stud remains in place.

## 2018-01-20 ENCOUNTER — Encounter (HOSPITAL_COMMUNITY): Payer: Self-pay | Admitting: Surgery

## 2018-06-30 ENCOUNTER — Other Ambulatory Visit: Payer: Self-pay | Admitting: Family Medicine

## 2018-08-19 ENCOUNTER — Other Ambulatory Visit: Payer: Self-pay | Admitting: Family Medicine

## 2018-08-25 ENCOUNTER — Telehealth: Payer: Self-pay | Admitting: Family Medicine

## 2018-08-25 NOTE — Telephone Encounter (Signed)
Pt needing a refill of medication.  She will need a webex for refills I have called and lmomtcb x 1

## 2018-09-19 ENCOUNTER — Encounter: Payer: Self-pay | Admitting: Family Medicine

## 2018-09-19 ENCOUNTER — Ambulatory Visit (INDEPENDENT_AMBULATORY_CARE_PROVIDER_SITE_OTHER): Payer: Self-pay | Admitting: Family Medicine

## 2018-09-19 ENCOUNTER — Other Ambulatory Visit: Payer: Self-pay

## 2018-09-19 DIAGNOSIS — F411 Generalized anxiety disorder: Secondary | ICD-10-CM

## 2018-09-19 MED ORDER — ESCITALOPRAM OXALATE 20 MG PO TABS: 20.0000 mg | ORAL_TABLET | Freq: Every day | ORAL | 3 refills | Status: DC

## 2018-09-19 MED ORDER — ONDANSETRON HCL 8 MG PO TABS: 8.0000 mg | ORAL_TABLET | Freq: Three times a day (TID) | ORAL | 2 refills | Status: DC | PRN

## 2018-09-19 MED ORDER — ALPRAZOLAM 0.5 MG PO TABS: 0.5000 mg | ORAL_TABLET | Freq: Two times a day (BID) | ORAL | 2 refills | Status: DC | PRN

## 2018-09-19 NOTE — Progress Notes (Signed)
Subjective:    Patient ID: Kristi Ewing, female    DOB: Mar 14, 1996, 23 y.o.   MRN: [id number]  HPI Virtual Visit via Video Note  I connected with the patient on 09/19/18 at  9:30 AM EDT by a video enabled telemedicine application and verified that I am speaking with the correct person using two identifiers.  Location patient: home Location provider:work or home office Persons participating in the virtual visit: patient, provider  I discussed the limitations of evaluation and management by telemedicine and the availability of in person appointments. The patient expressed understanding and agreed to proceed.   HPI: Here to follow up on anxiety medications. She is doing well in general although her anxiety has been acting up the past 3 weeks. She has had some nausea and has vomited on several occasions. She thinks this is simply due to anxiety. She denies any fever or cough or SOB. Her BMs are normal. No urinary symptoms. Her LMP was 3 and 1/2 weeks ago. Sleep and appetite are intact.    ROS: See pertinent positives and negatives per HPI.  Past Medical History:  Diagnosis Date  . Asthma    childhood  . Kidney stone   . PCOS (polycystic ovarian syndrome)    sees Dr. Louretta Shorten     Past Surgical History:  Procedure Laterality Date  . LAPAROSCOPIC CHOLECYSTECTOMY SINGLE PORT  01/19/2018   Procedure: LAPAROSCOPIC CHOLECYSTECTOMY SINGLE SITE WITH INTRAOPERATIVE CHOLANGIOGRAM;  Surgeon: Michael Boston, MD;  Location: [facility];  Service: General;;    Family History  Problem Relation Age of Onset  . Diabetes Unknown   . Anxiety disorder Unknown   . Hypertension Mother   . Diabetes Father   . Hypertension Father      Current Outpatient Medications:  .  ALPRAZolam (XANAX) 0.5 MG tablet, Take 1 tablet (0.5 mg total) by mouth 2 (two) times daily as needed for anxiety., Disp: 60 tablet, Rfl: 2 .  escitalopram (LEXAPRO) 20 MG tablet, Take 1 tablet (20 mg total) by mouth daily.,  Disp: 90 tablet, Rfl: 3 .  norgestimate-ethinyl estradiol (PREVIFEM) 0.25-35 MG-MCG tablet, Take 1 tablet by mouth daily., Disp: , Rfl:  .  ondansetron (ZOFRAN) 8 MG tablet, Take 1 tablet (8 mg total) by mouth every 8 (eight) hours as needed for nausea or vomiting., Disp: 30 tablet, Rfl: 2  EXAM:  VITALS per patient if applicable:  GENERAL: alert, oriented, appears well and in no acute distress  HEENT: atraumatic, conjunttiva clear, no obvious abnormalities on inspection of external nose and ears  NECK: normal movements of the head and neck  LUNGS: on inspection no signs of respiratory distress, breathing rate appears normal, no obvious gross SOB, gasping or wheezing  CV: no obvious cyanosis  MS: moves all visible extremities without noticeable abnormality  PSYCH/NEURO: pleasant and cooperative, no obvious depression or anxiety, speech and thought processing grossly intact  ASSESSMENT AND PLAN: Anxiety, we will refill the Lexapro daily and the prn Xanax. She can use Zofran for nausea. Recheck prn. She will see Dr. Corinna Capra for her annual GYN visit later this week.  Alysia Penna, MD  Discussed the following assessment and plan:  No diagnosis found.     I discussed the assessment and treatment plan with the patient. The patient was provided an opportunity to ask questions and all were answered. The patient agreed with the plan and demonstrated an understanding of the instructions.   The patient was advised to call back or seek  an in-person evaluation if the symptoms worsen or if the condition fails to improve as anticipated.     Review of Systems     Objective:   Physical Exam        Assessment & Plan:

## 2018-10-26 ENCOUNTER — Telehealth: Payer: Self-pay | Admitting: *Deleted

## 2018-10-26 ENCOUNTER — Encounter: Payer: Self-pay | Admitting: Family Medicine

## 2018-10-26 ENCOUNTER — Other Ambulatory Visit: Payer: Self-pay

## 2018-10-26 ENCOUNTER — Ambulatory Visit (INDEPENDENT_AMBULATORY_CARE_PROVIDER_SITE_OTHER): Payer: Self-pay | Admitting: Family Medicine

## 2018-10-26 DIAGNOSIS — F411 Generalized anxiety disorder: Secondary | ICD-10-CM

## 2018-10-26 MED ORDER — VENLAFAXINE HCL ER 150 MG PO CP24: 150.0000 mg | ORAL_CAPSULE | Freq: Every day | ORAL | 2 refills | Status: DC

## 2018-10-26 NOTE — Progress Notes (Signed)
Subjective:    Patient ID: Kristi Ewing, female    DOB: 07-26-1995, 23 y.o.   MRN: [id number]  HPI Virtual Visit via Video Note  I connected with the patient on 10/26/18 at 10:15 AM EDT by a video enabled telemedicine application and verified that I am speaking with the correct person using two identifiers.  Location patient: home Location provider:work or home office Persons participating in the virtual visit: patient, provider  I discussed the limitations of evaluation and management by telemedicine and the availability of in person appointments. The patient expressed understanding and agreed to proceed.   HPI: Here to follow up on anxiety. She has been taking Lexapro 20 mg daily for the past year or so, and it worked well at first. Now she says it does not work so well and her anxiety levels are high, especially on days she works. She has been using more Xanax than normal just to get by. She notes that her brother takes Effexor and he is very pleased with it.    ROS: See pertinent positives and negatives per HPI.  Past Medical History:  Diagnosis Date  . Asthma    childhood  . Kidney stone   . PCOS (polycystic ovarian syndrome)    sees Dr. Candice Camp     Past Surgical History:  Procedure Laterality Date  . LAPAROSCOPIC CHOLECYSTECTOMY SINGLE PORT  01/19/2018   Procedure: LAPAROSCOPIC CHOLECYSTECTOMY SINGLE SITE WITH INTRAOPERATIVE CHOLANGIOGRAM;  Surgeon: Karie Soda, MD;  Location: [facility];  Service: General;;    Family History  Problem Relation Age of Onset  . Diabetes Unknown   . Anxiety disorder Unknown   . Hypertension Mother   . Diabetes Father   . Hypertension Father      Current Outpatient Medications:  .  ALPRAZolam (XANAX) 0.5 MG tablet, Take 1 tablet (0.5 mg total) by mouth 2 (two) times daily as needed for anxiety., Disp: 60 tablet, Rfl: 2 .  norgestimate-ethinyl estradiol (PREVIFEM) 0.25-35 MG-MCG tablet, Take 1 tablet by mouth daily., Disp:  , Rfl:  .  ondansetron (ZOFRAN) 8 MG tablet, Take 1 tablet (8 mg total) by mouth every 8 (eight) hours as needed for nausea or vomiting., Disp: 30 tablet, Rfl: 2 .  venlafaxine XR (EFFEXOR XR) 150 MG 24 hr capsule, Take 1 capsule (150 mg total) by mouth daily with breakfast., Disp: 30 capsule, Rfl: 2  EXAM:  VITALS per patient if applicable:  GENERAL: alert, oriented, appears well and in no acute distress  HEENT: atraumatic, conjunttiva clear, no obvious abnormalities on inspection of external nose and ears  NECK: normal movements of the head and neck  LUNGS: on inspection no signs of respiratory distress, breathing rate appears normal, no obvious gross SOB, gasping or wheezing  CV: no obvious cyanosis  MS: moves all visible extremities without noticeable abnormality  PSYCH/NEURO: pleasant and cooperative, no obvious depression or anxiety, speech and thought processing grossly intact  ASSESSMENT AND PLAN: Anxiety, she will stop Lexapro and try Effexor XR 150 mg daily at bedtime. Use Xanax prn. Recheck in 3-4 weeks. Gershon Crane, MD  Discussed the following assessment and plan:  No diagnosis found.     I discussed the assessment and treatment plan with the patient. The patient was provided an opportunity to ask questions and all were answered. The patient agreed with the plan and demonstrated an understanding of the instructions.   The patient was advised to call back or seek an in-person evaluation if the symptoms  worsen or if the condition fails to improve as anticipated.     Review of Systems     Objective:   Physical Exam        Assessment & Plan:

## 2018-10-26 NOTE — Telephone Encounter (Signed)
  Copied from CRM [id number]. Topic: General - Other >> Oct 25, 2018  4:23 PM Jaquita Rector A wrote: Reason for CRM: Patient would like a call back to schedule an appointment to discuss her medication with Dr Clent Ridges. Ph# [phone]   Clinic RN attempted to reach patient to set up appointment. No answer. LVM for patient to return call.

## 2018-10-27 NOTE — Telephone Encounter (Signed)
Ov has already been done.

## 2018-11-14 ENCOUNTER — Telehealth: Payer: Self-pay | Admitting: *Deleted

## 2018-11-14 NOTE — Telephone Encounter (Signed)
Patient calling in stating she will have paperwork faxed over again.

## 2018-11-14 NOTE — Telephone Encounter (Signed)
Copied from [place] [phone]. Topic: General - Other >> Nov 14, 2018  7:56 AM [name] wrote: Reason for CRM: pt is calling to see if dr fry received FMLA paperwork from [employer] on friday   No forms have been received yet. Will update the pt once they are here.

## 2018-11-15 NOTE — Telephone Encounter (Signed)
Pt calling again and is requesting an update. Pt is requesting a call back. Please advise.

## 2018-11-21 ENCOUNTER — Encounter: Payer: Self-pay | Admitting: *Deleted

## 2018-11-21 NOTE — Telephone Encounter (Signed)
Mychart message has been sent to the patient.

## 2018-11-29 ENCOUNTER — Encounter: Payer: Self-pay | Admitting: *Deleted

## 2018-12-14 ENCOUNTER — Telehealth: Payer: Self-pay | Admitting: Family Medicine

## 2018-12-14 ENCOUNTER — Encounter: Payer: Self-pay | Admitting: Family Medicine

## 2018-12-14 NOTE — Telephone Encounter (Signed)
Forms have been received and placed in Dr. Barbie Banner folder

## 2018-12-14 NOTE — Telephone Encounter (Signed)
Pt came in and dropped off FMLA forms to be completed by provider.  Upon completion pt would like to have it faxed to [place] to the Attn: Samella Parr.  Form was placed in providers folder for completion.

## 2018-12-16 ENCOUNTER — Encounter: Payer: Self-pay | Admitting: Family Medicine

## 2018-12-16 DIAGNOSIS — Z0279 Encounter for issue of other medical certificate: Secondary | ICD-10-CM

## 2018-12-16 NOTE — Telephone Encounter (Signed)
I need more info to fill this out. How frequent are the episodes and how long is she out of work per episode?

## 2018-12-16 NOTE — Telephone Encounter (Signed)
Spoke with patient and got the necessary information. Patient is aware I will call her once the form is done.

## 2018-12-19 NOTE — Telephone Encounter (Signed)
Forms have been faxed. Left a detailed message on verified voice mail.  Nothing further needed.

## 2018-12-27 ENCOUNTER — Other Ambulatory Visit: Payer: Self-pay | Admitting: Family Medicine

## 2018-12-27 NOTE — Telephone Encounter (Signed)
Last fill 09/19/2018 Last OV 10/26/2018  Ok to fill?

## 2019-01-29 ENCOUNTER — Other Ambulatory Visit: Payer: Self-pay | Admitting: Family Medicine

## 2019-02-23 ENCOUNTER — Encounter: Payer: Self-pay | Admitting: Family Medicine

## 2019-03-22 ENCOUNTER — Other Ambulatory Visit: Payer: Self-pay | Admitting: Family Medicine

## 2019-04-10 ENCOUNTER — Ambulatory Visit: Payer: Self-pay

## 2019-04-10 NOTE — Telephone Encounter (Signed)
Patient called stating that she has has some mild intermittent rectal bleeding for about 1 week.  She states that she has had some  abdominal pain upper quadrants.  Today she went to have a BM and passed old blood streaked with bright blood. She has no other symptoms stating that she was getting dressed for work. Per office patient will go to UC for in person evaluation. None available at office.  Care advice read to patient she verbalized understanding and will go to UC.   Reason for Disposition . MODERATE rectal bleeding (small blood clots, passing blood without stool, or toilet water turns red)  Answer Assessment - Initial Assessment Questions 1. APPEARANCE of BLOOD: "What color is it?" "Is it passed separately, on the surface of the stool, or mixed in with the stool?"      Dark old with ting of bright 2. AMOUNT: "How much blood was passed?"    25 cc 3. FREQUENCY: "How many times has blood been passed with the stools?"     1st with bm 4. ONSET: "When was the blood first seen in the stools?" (Days or weeks)      1week 5. DIARRHEA: "Is there also some diarrhea?" If so, ask: "How many diarrhea stools were passed in past 24 hours?"      no 6. CONSTIPATION: "Do you have constipation?" If so, "How bad is it?"     no 7. RECURRENT SYMPTOMS: "Have you had blood in your stools before?" If so, ask: "When was the last time?" and "What happened that time?"    Past with hemrhiods and fissures 8. BLOOD THINNERS: "Do you take any blood thinners?" (e.g., Coumadin/warfarin, Pradaxa/dabigatran, aspirin)     no 9. OTHER SYMPTOMS: "Do you have any other symptoms?"  (e.g., abdominal pain, vomiting, dizziness, fever)     Abdominal pain upper quadrants 10. PREGNANCY: "Is there any chance you are pregnant?" "When was your last menstrual period?"      IUD  Protocols used: RECTAL BLEEDING-A-AH

## 2019-04-10 NOTE — Telephone Encounter (Signed)
Spoke to pt and she state she went to the UC. She was advised per the UC to go home and monitor her stools and look for any changes. Pt stated she is still having some dull discomfort in abdomen but the blood is not as much as this a.m.  Please advise

## 2019-04-11 NOTE — Telephone Encounter (Signed)
Set her up to see me in person OV

## 2019-04-12 NOTE — Telephone Encounter (Signed)
Called pt 2x no answer.

## 2019-04-19 NOTE — Telephone Encounter (Signed)
FYI

## 2019-04-19 NOTE — Telephone Encounter (Signed)
Called pt again no answer.

## 2019-04-29 ENCOUNTER — Other Ambulatory Visit: Payer: Self-pay | Admitting: Family Medicine

## 2019-06-05 ENCOUNTER — Other Ambulatory Visit: Payer: Self-pay | Admitting: Family Medicine

## 2019-06-12 ENCOUNTER — Encounter: Payer: Self-pay | Admitting: Family Medicine

## 2019-07-04 ENCOUNTER — Encounter: Payer: Self-pay | Admitting: Family Medicine

## 2019-07-05 ENCOUNTER — Other Ambulatory Visit: Payer: Self-pay | Admitting: Family Medicine

## 2019-07-15 IMAGING — US US ABDOMEN LIMITED
1 series · 14 of 25 positions shown · non-contrast
Comparison: 10/04/2016 renal ultrasound.

CLINICAL DATA: 21 y/o  F; epigastric pain.

EXAM:
ULTRASOUND ABDOMEN LIMITED RIGHT UPPER QUADRANT

[Series 1: us abdomen limited · 14 of 81 slices shown]
[im 1/81]
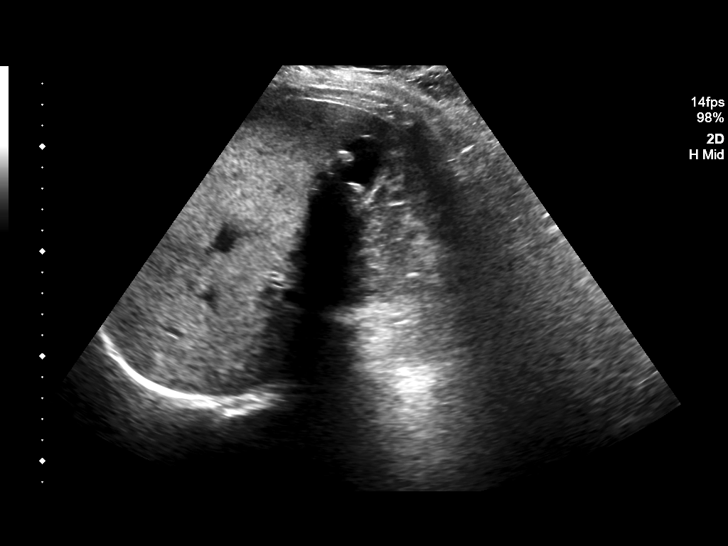
[im 7/81]
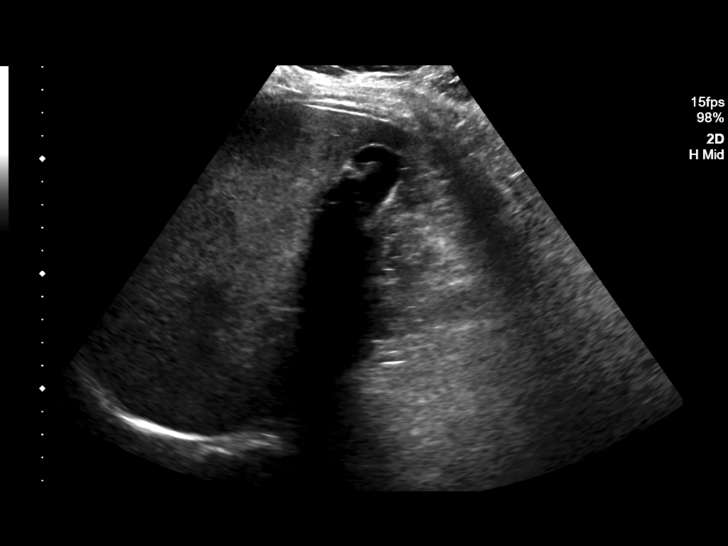
[im 14/81]
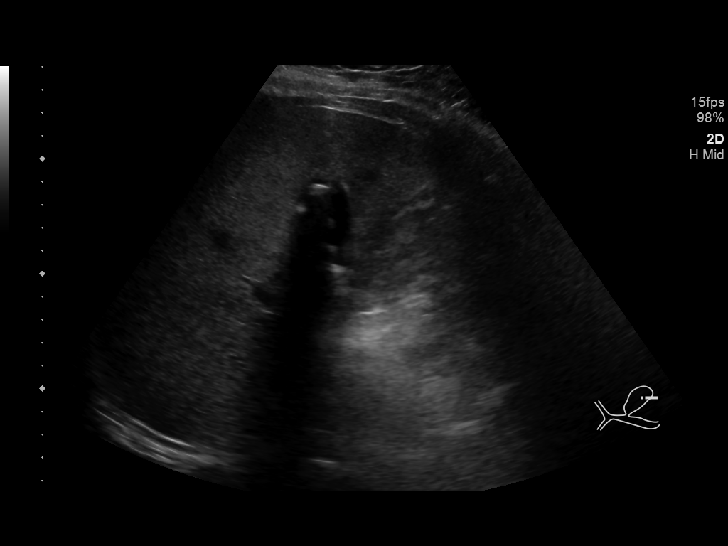
[im 21/81]
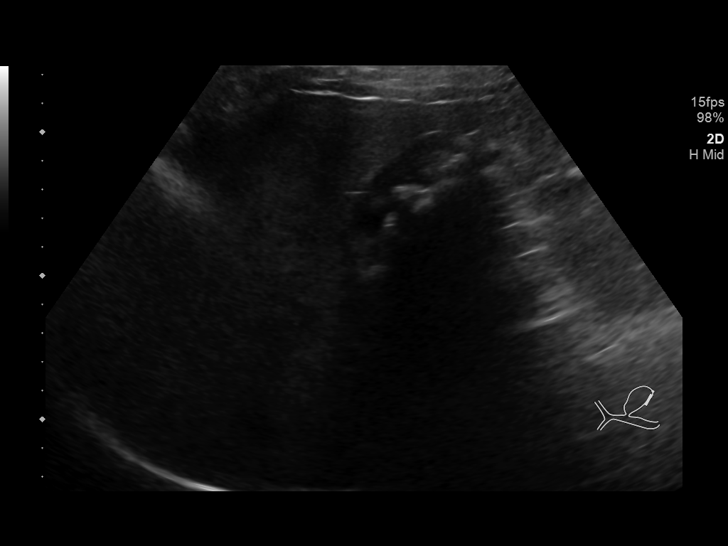
[im 27/81]
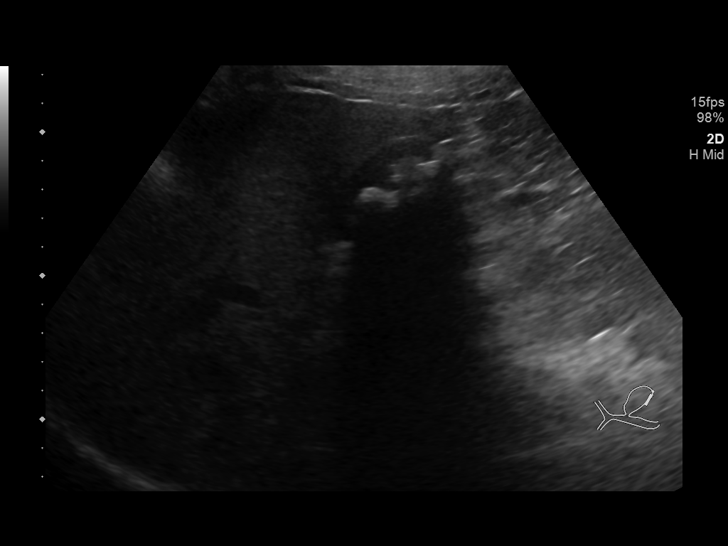
[im 31/81]
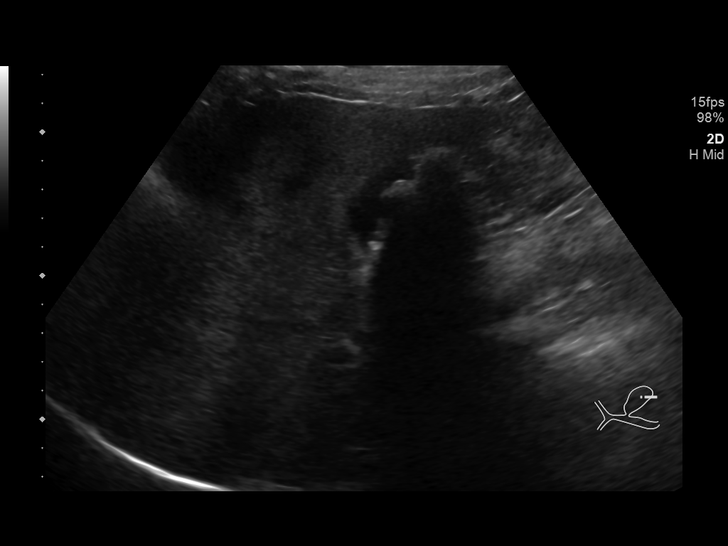
[im 37/81]
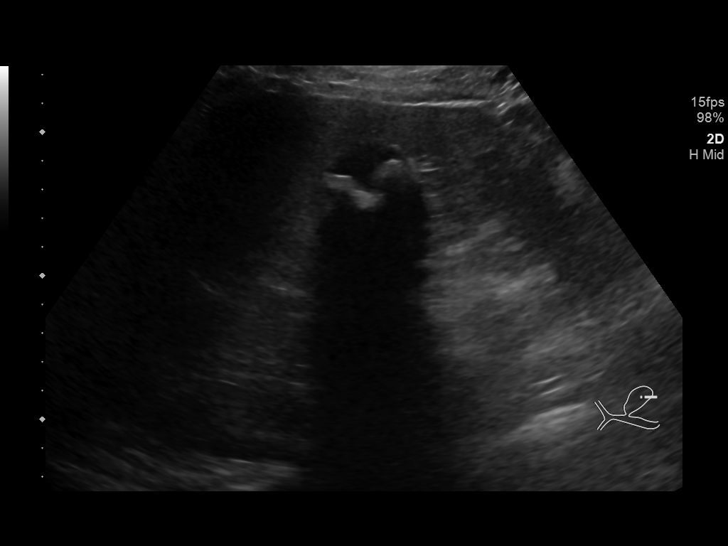
[im 44/81]
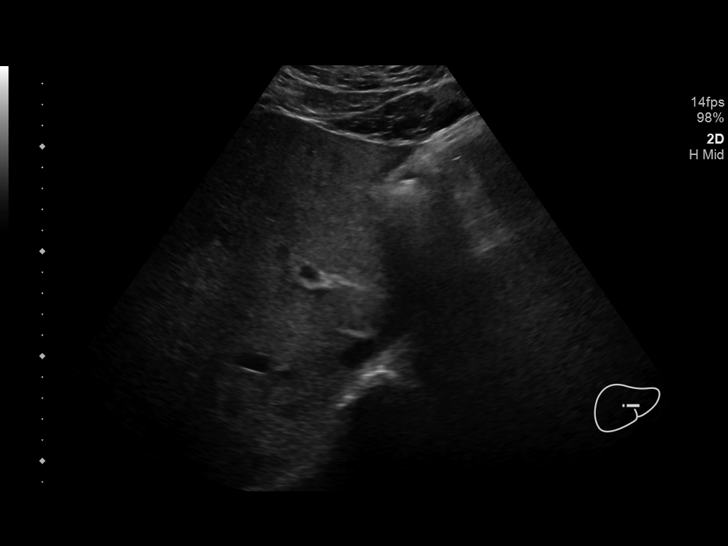
[im 51/81]
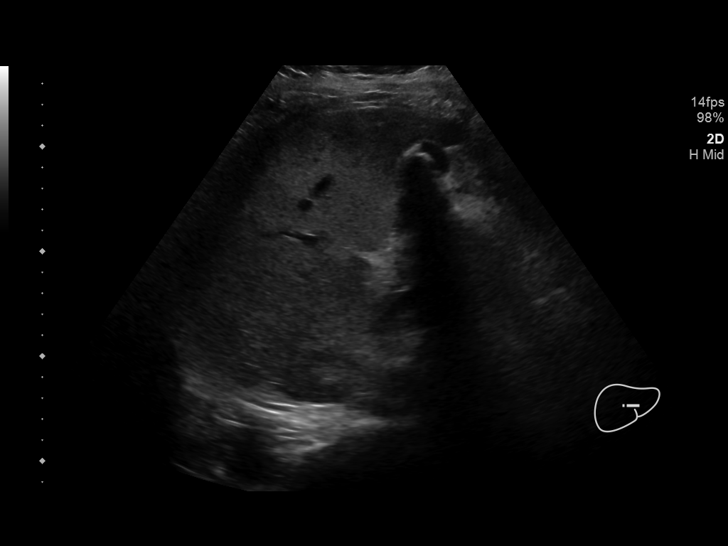
[im 54/81]
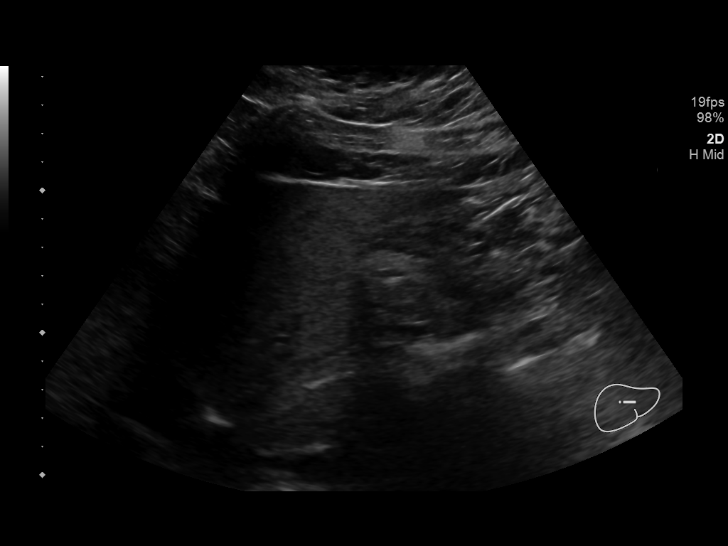
[im 61/81]
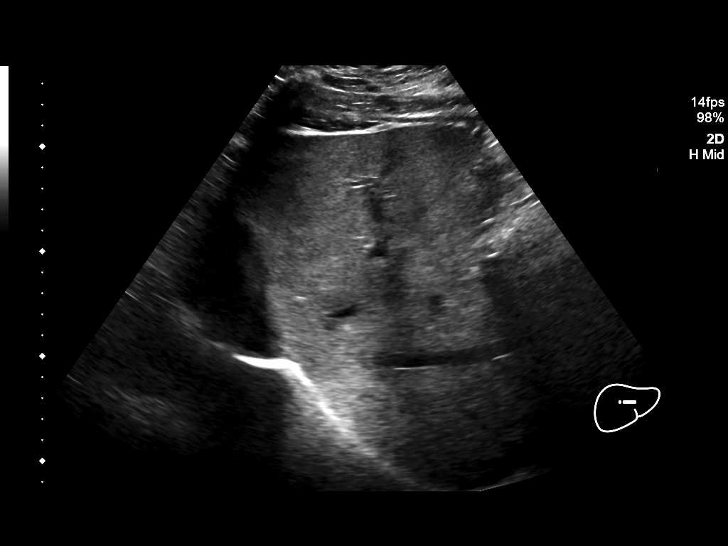
[im 67/81]
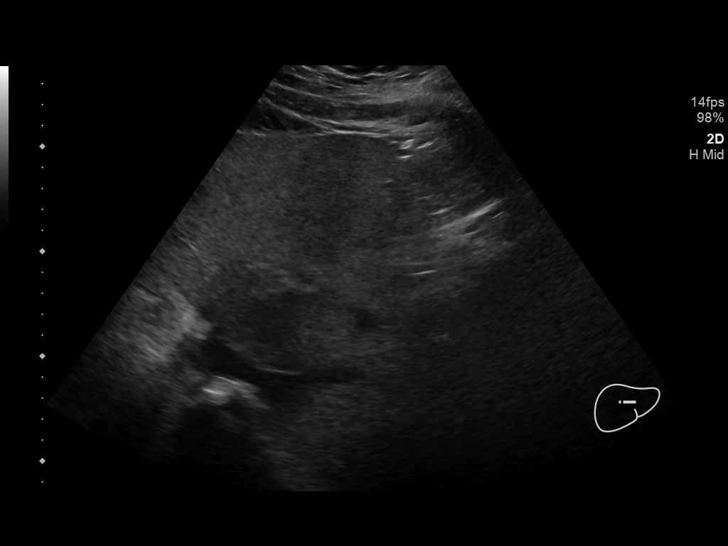
[im 74/81]
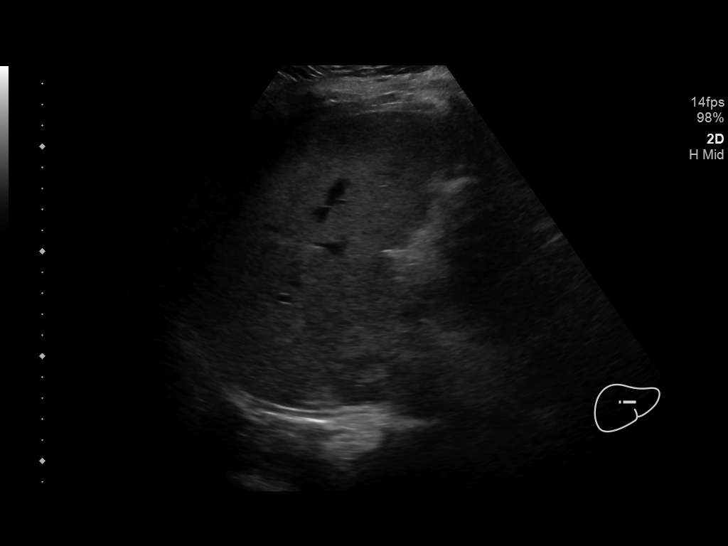
[im 81/81]
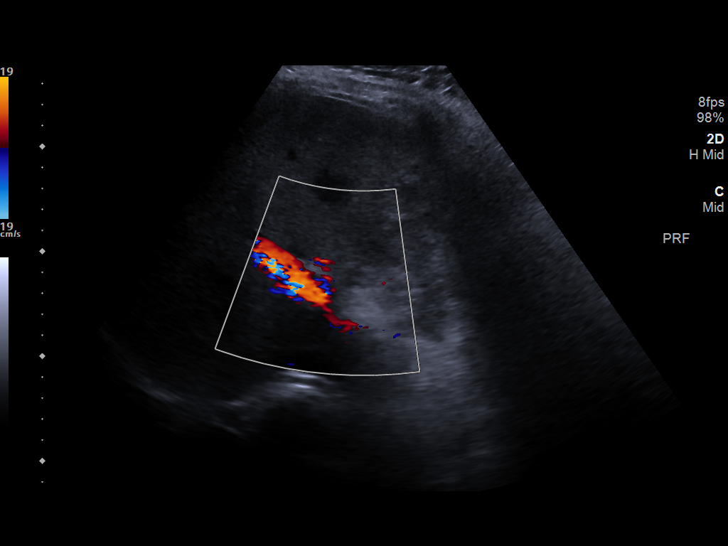

[14 of 25 positions shown; findings below may reference images not displayed]

FINDINGS: Gallbladder:

Cholelithiasis. No gallbladder wall thickening. No pericholecystic
fluid. Negative sonographic Murphy's sign.

Common bile duct:

Diameter: 3 mm

Liver:

No focal liver lesion identified. Increased liver echogenicity.
Portal vein is patent on color Doppler imaging with normal direction
of blood flow towards the liver.
IMPRESSION: 1. Cholelithiasis.  No secondary signs of acute cholecystitis.
2. Hepatic steatosis.

By: Nauzeer Athal M.D.

## 2019-08-03 ENCOUNTER — Other Ambulatory Visit: Payer: Self-pay

## 2019-08-04 ENCOUNTER — Ambulatory Visit (INDEPENDENT_AMBULATORY_CARE_PROVIDER_SITE_OTHER): Payer: No Typology Code available for payment source | Admitting: Family Medicine

## 2019-08-04 ENCOUNTER — Ambulatory Visit (INDEPENDENT_AMBULATORY_CARE_PROVIDER_SITE_OTHER): Payer: No Typology Code available for payment source

## 2019-08-04 ENCOUNTER — Encounter: Payer: Self-pay | Admitting: Family Medicine

## 2019-08-04 ENCOUNTER — Ambulatory Visit: Payer: No Typology Code available for payment source

## 2019-08-04 VITALS — BP 122/78 | HR 107 | Temp 98.2°F | Wt 273.4 lb

## 2019-08-04 DIAGNOSIS — R0789 Other chest pain: Secondary | ICD-10-CM

## 2019-08-04 DIAGNOSIS — R0781 Pleurodynia: Secondary | ICD-10-CM

## 2019-08-04 DIAGNOSIS — F411 Generalized anxiety disorder: Secondary | ICD-10-CM | POA: Diagnosis not present

## 2019-08-04 MED ORDER — VENLAFAXINE HCL ER 150 MG PO CP24: ORAL_CAPSULE | ORAL | 3 refills | Status: DC

## 2019-08-04 MED ORDER — HYDROCODONE-ACETAMINOPHEN 10-325 MG PO TABS: 1.0000 | ORAL_TABLET | ORAL | 0 refills | Status: AC | PRN

## 2019-08-04 MED ORDER — ALPRAZOLAM 0.5 MG PO TABS: ORAL_TABLET | ORAL | 5 refills | Status: DC

## 2019-08-04 NOTE — Progress Notes (Signed)
   Subjective:    Patient ID: Kristi Ewing, female    DOB: Feb 21, 1996, 24 y.o.   MRN: [id number]  HPI Here for several issues. First on 07-29-19 at home while cooking some pepper went up her nose and caused her to sneeze several times in a row. While sneezing she felt a severe sharp pain in the right ribs and she felt a "pop" sensation. The pain has persisted ever since. It hurts to take a deep breath but she is not SOB. No coughing or wheezing. She has tried ice, heat , and Ibuprofen with little relief. She finds that it feels better when she keeps her right arm pressed up against her side. She has gotten little sleep because it hurts to lie down. Also she wants to follow up on her anxiety. We had a virtual visit 3 weeks ago about her struggles with anxiety, and she was switched from Lexapro to Effexor XR 150 mg daily. This has been very helpful and she feels much better. She wants to continue taking this medication.   Review of Systems  Constitutional: Negative.   Respiratory: Negative.   Cardiovascular: Positive for chest pain. Negative for palpitations and leg swelling.  Neurological: Negative.   Psychiatric/Behavioral: Negative for agitation, behavioral problems, confusion, decreased concentration, dysphoric mood and hallucinations. The patient is nervous/anxious.        Objective:   Physical Exam Constitutional:      Comments: In pain, splinting her right arm against her side   Cardiovascular:     Rate and Rhythm: Normal rate and regular rhythm.     Pulses: Normal pulses.     Heart sounds: Normal heart sounds.  Pulmonary:     Effort: Pulmonary effort is normal. No respiratory distress.     Breath sounds: Normal breath sounds. No stridor. No wheezing, rhonchi or rales.     Comments: She is very tender in the right lateral ribs just under the breast. No crepitus  Neurological:     Mental Status: She is alert.           Assessment & Plan:  Her anxiety has improved. We  will refill the Effexor XR. We got Xrays of the right ribs to rule out a fracture, but I suspect she has had a rib separation. She can use Ibuprofen and ice during the day, and she can use Norco at night to help with sleep. This should heal on its own in the next 4-6 weeks.  Alysia Penna, MD

## 2019-08-10 ENCOUNTER — Encounter: Payer: Self-pay | Admitting: Family Medicine

## 2019-08-11 DIAGNOSIS — Z0279 Encounter for issue of other medical certificate: Secondary | ICD-10-CM

## 2019-08-11 NOTE — Telephone Encounter (Signed)
I understand. Have Yazhini make an OV to discuss these medications with me

## 2019-08-16 ENCOUNTER — Ambulatory Visit (INDEPENDENT_AMBULATORY_CARE_PROVIDER_SITE_OTHER): Payer: No Typology Code available for payment source | Admitting: Family Medicine

## 2019-08-16 ENCOUNTER — Encounter: Payer: Self-pay | Admitting: Family Medicine

## 2019-08-16 VITALS — BP 102/60 | HR 82 | Temp 98.2°F | Ht 67.0 in | Wt 275.0 lb

## 2019-08-16 DIAGNOSIS — F411 Generalized anxiety disorder: Secondary | ICD-10-CM

## 2019-08-16 DIAGNOSIS — S29011D Strain of muscle and tendon of front wall of thorax, subsequent encounter: Secondary | ICD-10-CM | POA: Diagnosis not present

## 2019-08-16 DIAGNOSIS — S29011A Strain of muscle and tendon of front wall of thorax, initial encounter: Secondary | ICD-10-CM | POA: Insufficient documentation

## 2019-08-16 NOTE — Progress Notes (Signed)
   Subjective:    Patient ID: Kristi Ewing, female    DOB: 10/23/1995, 24 y.o.   MRN: [id number]  HPI Here to follow up on a right rib injury that occurred a few weeks ago. She ended up missing just one day of work, 08-07-19, and she has been working since. The pain has almost disappeared, and it bothers her only when she reaches above her head. Otherwise her depression and anxiety have continued to be a real problem. After she started taking Effexor she felt better for a few weeks., but then the anxiety came roaring back. Now she says she "lives in fear" every day that something bad will happen to her, and she hates being by herself. She meets with Zella Ball weekly for therapy, and she has suggested that [facility] may have bipolar disorder. infact this does run in her family apparently. She has trouble sleeping but her appetite is intact.    Review of Systems  Constitutional: Negative.   Respiratory: Negative.   Cardiovascular: Positive for chest pain. Negative for palpitations and leg swelling.  Psychiatric/Behavioral: Positive for decreased concentration, dysphoric mood and sleep disturbance. Negative for agitation, confusion, hallucinations, self-injury and suicidal ideas. The patient is nervous/anxious.        Objective:   Physical Exam Constitutional:      Appearance: Normal appearance.  Cardiovascular:     Rate and Rhythm: Normal rate and regular rhythm.     Pulses: Normal pulses.     Heart sounds: Normal heart sounds.  Pulmonary:     Effort: Pulmonary effort is normal.     Breath sounds: Normal breath sounds.  Chest:     Chest wall: No tenderness.  Neurological:     General: No focal deficit present.     Mental Status: She is alert and oriented to person, place, and time.  Psychiatric:     Comments: Anxious and tearful            Assessment & Plan:  The rib injury has almost resolved. She will follow up as needed. FMLA forms were filled out for the day she  missed work. It does seem likely that she has bipolar disorder, and she really needs to see a psychiatrist to [facility] this. She agrees, and she will contact [facility] to set this up. In the meantime she will stay on her current medications.  Alysia Penna, MD

## 2019-08-17 ENCOUNTER — Ambulatory Visit: Payer: No Typology Code available for payment source | Admitting: Family Medicine

## 2019-09-28 ENCOUNTER — Ambulatory Visit (HOSPITAL_COMMUNITY)
Admission: AD | Admit: 2019-09-28 | Discharge: 2019-09-28 | Disposition: A | Discharge status: Home / Self Care | Payer: No Typology Code available for payment source | Attending: Psychiatry | Admitting: Psychiatry

## 2019-09-28 DIAGNOSIS — F419 Anxiety disorder, unspecified: Secondary | ICD-10-CM | POA: Diagnosis not present

## 2019-09-28 DIAGNOSIS — F329 Major depressive disorder, single episode, unspecified: Secondary | ICD-10-CM | POA: Diagnosis not present

## 2019-09-28 DIAGNOSIS — R45851 Suicidal ideations: Secondary | ICD-10-CM | POA: Diagnosis present

## 2019-09-28 NOTE — BH Assessment (Signed)
Assessment Note  Kristi Ewing is an 24 y.o. female. Patient presents to [facility] as a walk-in. Patient is voluntary and was brought by her mother. Patient recently diagnosed with Bipolar II Disorder by her therapist-Michelle Donzetta Matters. She also has a psychiatrist Dr. Toy Cookey. States that she is having more "lows" than "highs". Patient believes that her medications are causing some of her "lows".   Patient with passive suicidal thoughts for the past 4-5 months. No plan. No intent. She has no history of suicide attempts and/or self mutilating behaviors. Today her boyfriend of 9 months called and broke up with her over the phone. She believes this has lead to worsening depressive symptoms: hopelessness, worthlessness, isolating self from others, and guilt. Patient also experiencing an increase in anxiety. She has a family history of anxiety-mom and brother. She also has family history of substance use-father. States that someone on her fathers side also committed suicide but she is not sure who. Patient's appetite is fair. She reports sleeping ok (6 to 8 hrs per night).   No HI. No history of of aggressive and/or assaultive behaviors. No legal issues. No AVH's. Patient does not appear to be responding to internal stimuli.   Patient is oriented to time, person, place, and situation. Speech is normal. Insight and judgement is fair. Impulse control is fail. Mood is depressed. Patient is dressed appropriately.     Diagnosis: Bipolar II Disorder and Anxiety Disorder   Past Medical History:  Past Medical History:  Diagnosis Date  . Asthma    childhood  . Kidney stone   . PCOS (polycystic ovarian syndrome)    sees Dr. Louretta Shorten     Past Surgical History:  Procedure Laterality Date  . LAPAROSCOPIC CHOLECYSTECTOMY SINGLE PORT  01/19/2018   Procedure: LAPAROSCOPIC CHOLECYSTECTOMY SINGLE SITE WITH INTRAOPERATIVE CHOLANGIOGRAM;  Surgeon: Michael Boston, MD;  Location: [facility];  Service: General;;    Family  History:  Family History  Problem Relation Age of Onset  . Diabetes Unknown   . Anxiety disorder Unknown   . Hypertension Mother   . Diabetes Father   . Hypertension Father     Social History:  reports that she has never smoked. She has never used smokeless tobacco. She reports that she does not drink alcohol or use drugs.  Additional Social History:     CIWA: CIWA-Ar BP: (P) 130/81 Pulse Rate: (P) 87 COWS:    Allergies:  Allergies  Allergen Reactions  . Hydrocodone-Homatropine     Rash/hives     Home Medications: (Not in a hospital admission)   OB/GYN Status:  No LMP recorded.  General Assessment Data Is this a Tele or Face-to-Face Assessment?: Face-to-Face Is this an Initial Assessment or a Re-assessment for this encounter?: Initial Assessment Patient Accompanied by:: (brought by her mother ) Language Other than English: No Living Arrangements: (lives alont ) What gender do you identify as?: Female Marital status: Single Maiden name: Camera operator ) Pregnancy Status: No Living Arrangements: Other (Comment), Other relatives Can pt return to current living arrangement?: Yes Admission Status: Voluntary Is patient capable of signing voluntary admission?: Yes Referral Source: Self/Family/Friend Insurance type: (UMR with [place] )     [place] Arrangements: Other (Comment), Other relatives Legal Guardian: (no legal guardian ) Name of Psychiatrist: (Dr. Toy Cookey ) Name of Therapist: Zella Ball )  Education Status Is patient currently in school?: No Is the patient employed, unemployed or receiving disability?: Unemployed  Risk to self with the past 6  months Suicidal Ideation: Yes-Currently Present Has patient been a risk to self within the past 6 months prior to admission? : Yes Suicidal Intent: No Has patient had any suicidal intent within the past 6 months prior to admission? : No Is patient at risk for suicide?: No Suicidal Plan?: No Has  patient had any suicidal plan within the past 6 months prior to admission? : No Access to Means: No What has been your use of drugs/alcohol within the last 12 months?: (patient denies ) Previous Attempts/Gestures: No How many times?: (0) Other Self Harm Risks: (no history of self harm ) Triggers for Past Attempts: Other (Comment)(no past history ) Intentional Self Injurious Behavior: None Family Suicide History: Yes(brother-anxiety and dad-suicide attempt ) Recent stressful life event(s): Other (Comment), Loss (Comment)(boyfriend broke up with her over the phone today ) Persecutory voices/beliefs?: No Depression: Yes Depression Symptoms: Feeling angry/irritable, Feeling worthless/self pity, Loss of interest in usual pleasures, Fatigue Substance abuse history and/or treatment for substance abuse?: No Suicide prevention information given to non-admitted patients: Not applicable  Risk to Others within the past 6 months Homicidal Ideation: No Does patient have any lifetime risk of violence toward others beyond the six months prior to admission? : No Thoughts of Harm to Others: No Current Homicidal Intent: No Current Homicidal Plan: No Access to Homicidal Means: No Identified Victim: (n/a) History of harm to others?: No Assessment of Violence: None Noted Violent Behavior Description: (calm and cooperatie ) Does patient have access to weapons?: No Criminal Charges Pending?: No Does patient have a court date: No Is patient on probation?: No  Psychosis Hallucinations: None noted Delusions: None noted  Mental Status Report Appearance/Hygiene: Unremarkable Eye Contact: Poor Motor Activity: Freedom of movement Speech: Logical/coherent Level of Consciousness: Alert Mood: Depressed Affect: Appropriate to circumstance Anxiety Level: Severe Thought Processes: Coherent, Relevant Judgement: Unimpaired Orientation: Person, Time, Situation, Appropriate for developmental age,  Place Obsessive Compulsive Thoughts/Behaviors: None  Cognitive Functioning Concentration: Decreased Memory: Recent Intact, Remote Intact Is patient IDD: No Insight: Fair Impulse Control: Fair Appetite: Fair Have you had any weight changes? : No Change Amount of the weight change? (lbs): (n/a) Sleep: Decreased Total Hours of Sleep: (varies) Vegetative Symptoms: None  ADLScreening [facility] Assessment Services) Patient's cognitive ability adequate to safely complete daily activities?: Yes Patient able to express need for assistance with ADLs?: Yes Independently performs ADLs?: Yes (appropriate for developmental age)  Prior Inpatient Therapy Prior Inpatient Therapy: No  Prior Outpatient Therapy Prior Outpatient Therapy: Yes Prior Therapy Dates: (current ) Prior Therapy Facilty/Provider(s): (Dr. Toy Cookey and Nedra Hai ) Reason for Treatment: (Depression) Does patient have an ACCT team?: No Does patient have Intensive In-House Services?  : No Does patient have [facility] services? : No Does patient have P4CC services?: No  ADL Screening (condition at time of admission) Patient's cognitive ability adequate to safely complete daily activities?: Yes Is the patient deaf or have difficulty hearing?: No Does the patient have difficulty seeing, even when wearing glasses/contacts?: No Does the patient have difficulty concentrating, remembering, or making decisions?: No Patient able to express need for assistance with ADLs?: Yes Does the patient have difficulty dressing or bathing?: No Independently performs ADLs?: Yes (appropriate for developmental age) Weakness of Legs: None Weakness of Arms/Hands: None  Home Assistive Devices/Equipment Home Assistive Devices/Equipment: None  Therapy Consults (therapy consults require a physician order) PT Evaluation Needed: No OT Evalulation Needed: No SLP Evaluation Needed: No Abuse/Neglect Assessment (Assessment to be complete while patient is  alone) Physical Abuse:  Denies Verbal Abuse: Denies Sexual Abuse: Denies Exploitation of patient/patient's resources: Denies Self-Neglect: Denies     Regulatory affairs officer (For Healthcare) Does Patient Have a Medical Advance Directive?: No Nutrition Screen- [place] [facility] Patient's home diet: Regular        Disposition: Per Letitia Libra, NP, patient is ok to discharge. Patient is psych cleared. Patient referred to Psych IOP.  Disposition Initial Assessment Completed for this Encounter: Yes Patient referred to: Outpatient clinic referral(provided recommendations for [facility] IOP)  On Site Evaluation by:   Reviewed with Physician:    Waldon Merl 09/28/2019 6:51 PM

## 2019-09-28 NOTE — H&P (Signed)
Behavioral Health Medical Screening Exam  Kristi Ewing is an 24 y.o. female.  Patient presents to [facility] behavioral health voluntarily for walk-in assessment.  Patient alert and oriented, answers appropriately. Patient assessed by nurse practitioner. Patient reports suicidal ideations times approximately 4 months.  Patient denies plan or intent to harm self.  Patient denies history of suicide attempts, denies self-harm behaviors.  Patient states "I know I will not kill myself."  Patient contracts verbally for safety with this Probation officer. Patient denies homicidal ideations.  Patient denies auditory and visual hallucinations.  Patient denies symptoms of paranoia. Patient reports she lives with her older brother in [place].  Patient denies access to weapons.  Patient denies alcohol and substance use.  Patient employed as a Equities trader. Patient reports currently seen by outpatient psychotherapist, Zella Ball also by outpatient psychiatrist Dr. Launa Flight.  Patient reports recent medication updates.  Patient reports compliance with medications. Patient stressors include "my boyfriend broke up with me today over the phone" and my work causes anxiety. Patient gives verbal consent to speak with her mother Maudie Mercury, phone number [phone]. TTS counselor spoke with patient's mother who denies concerns for patient safety.  Patient and mother verbalized plan for patient to remain at mother's home "for a while."  Total Time spent with patient: 20 minutes  Psychiatric Specialty Exam: Physical Exam  Nursing note and vitals reviewed. Constitutional: She is oriented to person, place, and time. She appears well-developed.  HENT:  Head: Normocephalic.  Cardiovascular: Normal rate.  Respiratory: Effort normal.  Neurological: She is alert and oriented to person, place, and time.  Psychiatric: She has a normal mood and affect. Her speech is normal and behavior is normal. Judgment and thought content  normal. Cognition and memory are normal.    Review of Systems  Constitutional: Negative.   HENT: Negative.   Eyes: Negative.   Respiratory: Negative.   Cardiovascular: Negative.   Gastrointestinal: Negative.   Genitourinary: Negative.   Musculoskeletal: Negative.   Skin: Negative.   Neurological: Negative.   Psychiatric/Behavioral: Positive for suicidal ideas.    Blood pressure (P) 130/81, pulse (P) 87, temperature (P) 97.7 F (36.5 C), temperature source (P) Oral, resp. rate (P) 18, height (P) 5' 7"  (1.702 m), weight (P) 122.5 kg, SpO2 (P) 99 %.Body mass index is 42.29 kg/m (pended).  General Appearance: Casual and Fairly Groomed  Eye Contact:  Good  Speech:  Clear and Coherent and Normal Rate  Volume:  Normal  Mood:  Depressed  Affect:  Appropriate and Congruent  Thought Process:  Coherent, Goal Directed and Descriptions of Associations: Intact  Orientation:  Full (Time, Place, and Person)  Thought Content:  Logical  Suicidal Thoughts:  Yes.  without intent/plan  Homicidal Thoughts:  No  Memory:  Immediate;   Good Recent;   Good Remote;   Good  Judgement:  Good  Insight:  Good  Psychomotor Activity:  Normal  Concentration: Concentration: Good and Attention Span: Good  Recall:  Good  Fund of Knowledge:Good  Language: Good  Akathisia:  No  Handed:  Right  AIMS (if indicated):     Assets:  Communication Skills Desire for Improvement Financial Resources/Insurance Housing Intimacy Leisure Time [place] Talents/Skills Transportation Vocational/Educational  Sleep:       Musculoskeletal: Strength & Muscle Tone: within normal limits Gait & Station: normal Patient leans: N/A  Blood pressure (P) 130/81, pulse (P) 87, temperature (P) 97.7 F (36.5 C), temperature source (P) Oral, resp. rate (P) 18,  height (P) 5' 7"  (1.702 m), weight (P) 122.5 kg, SpO2 (P) 99 %.  Recommendations: Follow-up with established outpatient.  Patient  given information introducing: [facility] intensive outpatient program.  Based on my evaluation the patient does not appear to have an emergency medical condition.  Emmaline Kluver, FNP 09/28/2019, 6:13 PM

## 2019-10-02 ENCOUNTER — Other Ambulatory Visit (HOSPITAL_COMMUNITY): Payer: No Typology Code available for payment source | Attending: Psychiatry | Admitting: Psychiatry

## 2019-10-02 ENCOUNTER — Other Ambulatory Visit: Payer: Self-pay

## 2019-10-02 DIAGNOSIS — R454 Irritability and anger: Secondary | ICD-10-CM | POA: Insufficient documentation

## 2019-10-02 DIAGNOSIS — Z8659 Personal history of other mental and behavioral disorders: Secondary | ICD-10-CM | POA: Insufficient documentation

## 2019-10-02 DIAGNOSIS — F329 Major depressive disorder, single episode, unspecified: Secondary | ICD-10-CM | POA: Insufficient documentation

## 2019-10-02 DIAGNOSIS — Z818 Family history of other mental and behavioral disorders: Secondary | ICD-10-CM | POA: Insufficient documentation

## 2019-10-02 DIAGNOSIS — Z885 Allergy status to narcotic agent status: Secondary | ICD-10-CM | POA: Insufficient documentation

## 2019-10-02 DIAGNOSIS — F419 Anxiety disorder, unspecified: Secondary | ICD-10-CM | POA: Insufficient documentation

## 2019-10-02 DIAGNOSIS — Z79899 Other long term (current) drug therapy: Secondary | ICD-10-CM | POA: Insufficient documentation

## 2019-10-02 NOTE — Progress Notes (Signed)
Virtual Visit via Video Note  I connected with Kristi Ewing on @TODAY @ at 1400 by a video enabled telemedicine application and verified that I am speaking with the correct person using two identifiers. I discussed the limitations of evaluation and management by telemedicine and the availability of in person appointments. The patient expressed understanding and agreed to proceed. I discussed the assessment and treatment plan with the patient. The patient was provided an opportunity to ask questions and all were answered. The patient agreed with the plan and demonstrated an understanding of the instructions.   The patient was advised to call back or seek an in-person evaluation if the symptoms worsen or if the condition fails to improve as anticipated.  I provided 60 minutes of non-face-to-face time during this encounter.      Comprehensive Clinical Assessment (CCA) Note  10/02/2019 Kristi Ewing [id number]  Visit Diagnosis:   No diagnosis found.    CCA Part One  Part One has been completed on paper by the patient.  (See scanned document in Chart Review)  CCA Part Two A  Intake/Chief Complaint:  CCA Intake With Chief Complaint CCA Part Two Date: 10/02/19 CCA Part Two Time: 1529 Chief Complaint/Presenting Problem: This is a 24 yr old, single, employed, Caucasian female who was referred per therapist Kristi Ewing; treatment for worsening anxiety and depression.  States she's been struggling with anxiety since age 26.  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  "I started this job last year and purchased a home also.  I just feel like I've done so much so fast and it's triggering my anxiety."  Pt denies any prior psychiatric hospitalizations.  Also, denies any suicide attempts or gestures.  Has been seeing Dr. Launa Flight for 2 months and Kristi Ewing for ~ 1 yr.  Family hx:  Brother, M-GM, Mother (Anxiety); Deceased M-Uncle  (Paranoid-Schizophrenic). Patients Currently Reported Symptoms/Problems: Panic attacks, sadness, isolative, poor appetite (has lost 15lbs within 1 1/2 months), decreased sleep, tearful, irritable, anhedonia, loss of motivation, no energy, fatigue, ruminating, racing thoughts. Collateral Involvement: Reports that her brother, parents, M-GM and boyfriend are supportive. Individual's Strengths: "I am very bubbly." Individual's Preferences: "I need to work on being overly sensitive." Type of Services Patient Feels Are Needed: MH-IOP  Mental Health Symptoms Depression:  Depression: Change in energy/activity, Difficulty Concentrating, Fatigue, Increase/decrease in appetite, Irritability, Sleep (too much or little), Tearfulness, Weight gain/loss  Mania:  Mania: N/A  Anxiety:   Anxiety: Restlessness, Tension, Worrying  Psychosis:  Psychosis: N/A  Trauma:  Trauma: N/A  Obsessions:  Obsessions: N/A  Compulsions:  Compulsions: N/A  Inattention:  Inattention: N/A  Hyperactivity/Impulsivity:  Hyperactivity/Impulsivity: N/A  Oppositional/Defiant Behaviors:  Oppositional/Defiant Behaviors: N/A  Borderline Personality:  Emotional Irregularity: N/A  Other Mood/Personality Symptoms:      Mental Status Exam Appearance and self-care  Stature:  Stature: Average  Weight:  Weight: Obese  Clothing:  Clothing: Casual  Grooming:  Grooming: Normal  Cosmetic use:  Cosmetic Use: None  Posture/gait:  Posture/Gait: Normal  Motor activity:  Motor Activity: Not Remarkable  Sensorium  Attention:  Attention: Normal  Concentration:  Concentration: Anxiety interferes  Orientation:  Orientation: X5  Recall/memory:  Recall/Memory: Normal  Affect and Mood  Affect:  Affect: Anxious  Mood:  Mood: Depressed  Relating  Eye contact:  Eye Contact: Normal  Facial expression:  Facial Expression: Anxious  Attitude toward examiner:  Attitude Toward Examiner: Cooperative  Thought and  Language  Speech flow: Speech Flow:  Normal  Thought content:  Thought Content: Appropriate to mood and circumstances  Preoccupation:     Hallucinations:     Organization:     Transport planner of Knowledge:  Fund of Knowledge: Average  Intelligence:  Intelligence: Average  Abstraction:  Abstraction: Normal  Judgement:  Judgement: Normal  Reality Testing:  Reality Testing: Adequate  Insight:  Insight: Good  Decision Making:  Decision Making: Vacilates  Social Functioning  Social Maturity:  Social Maturity: Isolates  Social Judgement:  Social Judgement: Normal  Stress  Stressors:  Stressors: Work, Illness(Has PCOS)  Coping Ability:  Coping Ability: English as a second language teacher Deficits:     Supports:      Family and Psychosocial History: Family history Marital status: Single What is your sexual orientation?: heterosexual Does patient have children?: No  Childhood History:  Childhood History By whom was/is the patient raised?: Both parents Additional childhood history information: Born in [place]. Describes childhood as being "great/normal."  Dx with anxiety at age 22.  Started missing school a little more then.  Denies any trauma or abuse.  States her materanl uncle died d/t drug overdose when she was young, but states that didn't affect her in any way. Does patient have siblings?: Yes Number of Siblings: 1 Description of patient's current relationship with siblings: Older brother resides with her Did patient suffer any verbal/emotional/physical/sexual abuse as a child?: No Did patient suffer from severe childhood neglect?: No Has patient ever been sexually abused/assaulted/raped as an adolescent or adult?: No Was the patient ever a victim of a crime or a disaster?: No Witnessed domestic violence?: No Has patient been effected by domestic violence as an adult?: No  CCA Part Two B  Employment/Work Situation: Employment / Work Situation Employment situation: Employed Where is patient currently employed?: [employer] long has patient been employed?: Since Sept 2020 Patient's job has been impacted by current illness: Yes Describe how patient's job has been impacted: Difficulty functioning Did You Receive Any Psychiatric Treatment/Services While in the [employer]?: No Are There Guns or Other Weapons in [place]?: No  Education: Education Did Teacher, adult education From [employer]?: No Did Physicist, medical?: Yes What Type of College Degree Do you Have?: BSN Did Heritage manager?: No What Was Your Major?: Nursing Did You Have An Individualized Education Program (IIEP): No Did You Have Any Difficulty At School?: No  Religion: Religion/Spirituality Are You A Religious Person?: Yes What is Your Religious Affiliation?: Methodist  Leisure/Recreation: Leisure / Recreation Leisure and Hobbies: Playing with pets  Exercise/Diet: Exercise/Diet Do You Exercise?: No Have You Gained or Lost A Significant Amount of Weight in the Past Six Months?: Yes-Lost Number of Pounds Lost?: 15 Do You Follow a Special Diet?: No Do You Have Any Trouble Sleeping?: Yes Explanation of Sleeping Difficulties: decreased sleep if doesn't take sleep meds  CCA Part Two C  Alcohol/Drug Use: Alcohol / Drug Use Pain Medications: cc: MAR Prescriptions: cc:  MAR Over the Counter: cc: MAR History of alcohol / drug use?: No history of alcohol / drug abuse                      CCA Part Three  ASAM's:  Six Dimensions of Multidimensional Assessment  Dimension 1:  Acute Intoxication and/or Withdrawal Potential:     Dimension 2:  Biomedical Conditions and Complications:     Dimension 3:  Emotional, Behavioral, or Cognitive Conditions and Complications:  Dimension 4:  Readiness to Change:     Dimension 5:  Relapse, Continued use, or Continued Problem Potential:     Dimension 6:  Recovery/Living Environment:      Substance use Disorder (SUD)    Social Function:  Social Functioning Social Maturity:  Isolates Social Judgement: Normal  Stress:  Stress Stressors: Work, Illness(Has PCOS) Coping Ability: Overwhelmed Patient Takes Medications The Way The Doctor Instructed?: Yes Priority Risk: Moderate Risk  Risk Assessment- Self-Harm Potential: Risk Assessment For Self-Harm Potential Thoughts of Self-Harm: No current thoughts Method: No plan Availability of Means: No access/NA  Risk Assessment -Dangerous to Others Potential: Risk Assessment For Dangerous to Others Potential Method: No Plan Availability of Means: No access or NA Intent: Vague intent or NA Notification Required: No need or identified person  DSM5 Diagnoses: Patient Active Problem List   Diagnosis Date Noted  . Intercostal muscle strain 08/16/2019  . Morbid obesity with body mass index of 40.0-49.9 ([facility]) 01/18/2018  . PCOS (polycystic ovarian syndrome) 12/24/2014  . Pyrexia 08/24/2014  . Anxiety state 10/15/2009    Patient Centered Plan: Patient is on the following Treatment Plan(s):  Anxiety and Depression  Recommendations for Services/Supports/Treatments: Recommendations for Services/Supports/Treatments Recommendations For Services/Supports/Treatments: IOP (Intensive Outpatient Program)  Treatment Plan Summary:  Oriented pt to virtual MH-IOP.  Answered all questions.  Encouraged pt to contact her insurance company to verify her benefits.  Pt gave verbal consent for treatment, to release chart information to referred providers and to complete any forms if needed.  Pt also gave consent for attending group virtually d/t COVID-19 social distancing restrictions.  Encouraged support groups through [facility] [place].  F/U with Dr. Toy Cookey and Kristi Ewing (therapist).  Referrals to Alternative Service(s): Referred to Alternative Service(s):   Place:   Date:   Time:    Referred to Alternative Service(s):   Place:   Date:   Time:    Referred to Alternative Service(s):   Place:   Date:   Time:    Referred to  Alternative Service(s):   Place:   Date:   Time:     Shanautica Forker, RITA, M.Ed, CNA

## 2019-10-03 ENCOUNTER — Encounter (HOSPITAL_COMMUNITY): Payer: Self-pay | Admitting: Psychiatry

## 2019-10-03 ENCOUNTER — Other Ambulatory Visit: Payer: Self-pay

## 2019-10-03 ENCOUNTER — Other Ambulatory Visit (HOSPITAL_COMMUNITY): Payer: No Typology Code available for payment source | Admitting: Psychiatry

## 2019-10-03 DIAGNOSIS — R454 Irritability and anger: Secondary | ICD-10-CM | POA: Diagnosis not present

## 2019-10-03 DIAGNOSIS — Z885 Allergy status to narcotic agent status: Secondary | ICD-10-CM | POA: Diagnosis not present

## 2019-10-03 DIAGNOSIS — Z8659 Personal history of other mental and behavioral disorders: Secondary | ICD-10-CM | POA: Diagnosis not present

## 2019-10-03 DIAGNOSIS — Z79899 Other long term (current) drug therapy: Secondary | ICD-10-CM | POA: Diagnosis not present

## 2019-10-03 DIAGNOSIS — F329 Major depressive disorder, single episode, unspecified: Secondary | ICD-10-CM | POA: Diagnosis present

## 2019-10-03 DIAGNOSIS — Z818 Family history of other mental and behavioral disorders: Secondary | ICD-10-CM | POA: Diagnosis not present

## 2019-10-03 DIAGNOSIS — F411 Generalized anxiety disorder: Secondary | ICD-10-CM

## 2019-10-03 DIAGNOSIS — F419 Anxiety disorder, unspecified: Secondary | ICD-10-CM | POA: Diagnosis not present

## 2019-10-03 NOTE — Addendum Note (Signed)
Addended by: Sheralyn Boatman on: 10/03/2019 02:12 PM   Modules accepted: Orders

## 2019-10-03 NOTE — Progress Notes (Signed)
Virtual Visit via Video Note  I connected with Kristi Ewing on 10/03/19 at  9:00 AM EDT by a video enabled telemedicine application and verified that I am speaking with the correct person using two identifiers.   Case Manager discussed the limitations of evaluation and management by telemedicine and the availability of in person appointments. The patient expressed understanding and agreed to proceed.  Location:  Patient: Patient Home Provider: [facility] OPT Office  History of Present Illness: GAD  Observations/Objective: Case Manager checked in with all participants to review discharge dates, insurance authorizations, work-related documents and needs for the treatment team. Clinician facilitated a check-in with group members to assess mood and current functioning. Clinician introduced self and prompted client to provide an update on functioning since last group session. Clinician provided calming meditation focused on Gratitude and encouraged members to notice wandering thoughts and bring them back to the present moment. 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 Client relayed that she was only out of school for two years prior to the pandemic and has felt overwhelmed in managing this while also exploring various areas of nursing to find a setting that she enjoys. Client related to others in the group and relayed feelings of comfort and validation. Client denied any current SI/HI/psychosis.   Psycho-educational portion of group centered on self-care along with the impact it has on mental health. Clinician shared psychoeducational video 'Self Care: What It Really Is' which challenges traditional thoughts on what self-care looks like. Clinician allowed space for member's to process their responses to the video and encouraged them to share with peers.  Clinician utilized open  ended questions to prompt discussion when needed and appropriate re-direction. Clinician praised client's insight and willingness to engage. Client engaged in discussion and identified that she typically thinks of "face masks, massages, and relaxation" when thinking of self-care. Client verbalized that she has only done this once in her life and relayed area of improvement in taking care of herself to avoid burn out.   Clinician shared resource handout, 'Self Care Assessment' which included examples of multiple dimensions of self-care including- Physical, Emotional, Social, Spiritual, and Professional. Clinician encouraged members to complete provided assessment and identify areas in which they are doing well as well as areas that can be improved. Clinician and members engaged in discussion on their responses including barriers to their completion of self-care and motivation to make positive change in their self-care practice. Clinician facilitated check out assessing an area of gratitude and self-care activity that members will engage in prior to next group session. Client completed provided assessment and relayed that she can benefit from increasing emotional self care by reporting "it's my worst area". Client relayed, "I don't have any hobbies" and processed thoughts and feelings related to decreased energy and motivation. Client actively engaged in check out and reported that today she is grateful for "the roof over my head, my friends, and family". Client reported that she will engage in self-care this afternoon by picking up medications to take regularly as prescribed as well as attend her boyfriend's graduation party.  Assessment and Plan: Clinician recommends that patient remains in IOP treatment to better manage mental health symptoms and continue to address treatment plan goals. Clinician recommends adherence to crisis/safety plan, taking medications as prescribed, and following up with medical  professionals if any issues arise.  Follow Up Instructions: Clinician  will send Webex link for next session. The patient was advised to call back or seek an in-person evaluation if the symptoms worsen or if the condition fails to improve as anticipated.   I provided 180 minutes of non-face-to-face time during this encounter.   Renee Harder, LCSW

## 2019-10-04 ENCOUNTER — Other Ambulatory Visit (HOSPITAL_COMMUNITY): Payer: No Typology Code available for payment source | Admitting: Psychiatry

## 2019-10-04 ENCOUNTER — Encounter (HOSPITAL_COMMUNITY): Payer: Self-pay | Admitting: Family

## 2019-10-04 ENCOUNTER — Other Ambulatory Visit: Payer: Self-pay

## 2019-10-04 DIAGNOSIS — F329 Major depressive disorder, single episode, unspecified: Secondary | ICD-10-CM | POA: Diagnosis not present

## 2019-10-04 DIAGNOSIS — F322 Major depressive disorder, single episode, severe without psychotic features: Secondary | ICD-10-CM

## 2019-10-04 DIAGNOSIS — F411 Generalized anxiety disorder: Secondary | ICD-10-CM

## 2019-10-04 NOTE — Progress Notes (Signed)
Psychiatric Initial Adult Assessment   Patient Identification: Kristi Ewing MRN:  [id number] Date of Evaluation:  10/04/2019 Referral Source: MD Toy Cookey  Chief Complaint:  Depression and Anxiety  Visit Diagnosis: No diagnosis found.  History of Present Illness:  Kristi Ewing is a 24 year old Caucasian female presents with worsening depression and anxiety.  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  Patient stated she is unable to identify any stressors and/or triggers as she states " my life have been going fairly well I recently bought a home."  Patient reports poor concentration, mood irritability and poor appetite.   Kristi Ewing reported she has has been dealing with mental illness since she was 24 years old.  Reports she is currently followed by psychiatrist and a therapist.  Reports she is prescribed Effexor, Seroquel, Lamictal and Klonopin to help stabilize her mood.  She reported taking and tolerating medications well.   Denies previous inpatient admissions.  Denied history of physical or sexual abuse.  Denies illicit drug use.  Reports drinking socially.  Reported family history with mental illness: Brother: Depression anxiety.  Maternal grandmother: Anxiety maternal uncle schizophrenia.  Patient to start intensive outpatient programming on 10/03/2019  Associated Signs/Symptoms: Depression Symptoms:  depressed mood, feelings of worthlessness/guilt, difficulty concentrating, anxiety, (Hypo) Manic Symptoms:  Irritable Mood, Anxiety Symptoms:  Excessive Worry, Panic Symptoms, Social Anxiety, Psychotic Symptoms:  Hallucinations: None PTSD Symptoms: NA  Past Psychiatric History:   Previous Psychotropic Medications: No   Substance Abuse History in the last 12 months:  No.  Consequences of Substance  Abuse: NA  Past Medical History:  Past Medical History:  Diagnosis Date  . Asthma    childhood  . Kidney stone   . PCOS (polycystic ovarian syndrome)    sees Dr. Louretta Shorten     Past Surgical History:  Procedure Laterality Date  . LAPAROSCOPIC CHOLECYSTECTOMY SINGLE PORT  01/19/2018   Procedure: LAPAROSCOPIC CHOLECYSTECTOMY SINGLE SITE WITH INTRAOPERATIVE CHOLANGIOGRAM;  Surgeon: Michael Boston, MD;  Location: [facility];  Service: General;;    Family Psychiatric History:   Family History:  Family History  Problem Relation Age of Onset  . Diabetes Other   . Anxiety disorder Other   . Hypertension Mother   . Anxiety disorder Mother   . Diabetes Father   . Hypertension Father   . Anxiety disorder Brother   . Schizophrenia Maternal Uncle   . Anxiety disorder Maternal Grandmother     Social History:   Social History   Socioeconomic History  . Marital status: Single    Spouse name: Not on file  . Number of children: Not on file  . Years of education: Not on file  . Highest education level: Not on file  Occupational History  . Not on file  Tobacco Use  . Smoking status: Never Smoker  . Smokeless tobacco: Never Used  Substance and Sexual Activity  . Alcohol use: No    Alcohol/week: 0.0 standard drinks  . Drug use: No  . Sexual activity: Never    Birth control/protection: Abstinence  Other Topics Concern  . Not on file  Social History Narrative  . Not on file   Social Determinants of Health   Financial Resource Strain:   . Difficulty of Paying Living Expenses:   Food Insecurity:   . Worried About Estate manager/land agent  of Food in the Last Year:   . [place] in the Last Year:   Transportation Needs:   . Lack of Transportation (Medical):   Marland Kitchen Lack of Transportation (Non-Medical):   Physical Activity:   . Days of Exercise per Week:   . Minutes of Exercise per Session:   Stress:   . Feeling of Stress :   Social Connections:   . Frequency of Communication with  Friends and Family:   . Frequency of Social Gatherings with Friends and Family:   . Attends Religious Services:   . Active Member of Clubs or Organizations:   . Attends Archivist Meetings:   Marland Kitchen Marital Status:     Additional Social History:   Allergies:   Allergies  Allergen Reactions  . Hydrocodone-Homatropine     Rash/hives     Metabolic Disorder Labs: No results found for: HGBA1C, MPG No results found for: PROLACTIN No results found for: CHOL, TRIG, HDL, CHOLHDL, VLDL, LDLCALC Lab Results  Component Value Date   TSH 1.42 10/15/2009    Therapeutic Level Labs: No results found for: LITHIUM No results found for: CBMZ No results found for: VALPROATE  Current Medications: Current Outpatient Medications  Medication Sig Dispense Refill  . clonazePAM (KLONOPIN) 1 MG tablet Take 1 mg by mouth 2 (two) times daily.    Marland Kitchen lamoTRIgine (LAMICTAL) 100 MG tablet Take 100 mg by mouth daily.    . ondansetron (ZOFRAN) 8 MG tablet Take 1 tablet (8 mg total) by mouth every 8 (eight) hours as needed for nausea or vomiting. (Patient not taking: Reported on 08/16/2019) 30 tablet 2  . QUEtiapine (SEROQUEL) 200 MG tablet Take 200 mg by mouth at bedtime.    Marland Kitchen venlafaxine XR (EFFEXOR-XR) 150 MG 24 hr capsule TAKE 1 CAPSULE ONCE DAILY WITH BREAKFAST. 90 capsule 3   No current facility-administered medications for this visit.    Musculoskeletal:   Psychiatric Specialty Exam: Review of Systems  There were no vitals taken for this visit.There is no height or weight on file to calculate BMI.  General Appearance: NA  Eye Contact:  NA  Speech:  Clear and Coherent  Volume:  Normal  Mood:  Anxious and Depressed  Affect:  Congruent  Thought Process:  Coherent  Orientation:  Full (Time, Place, and Person)  Thought Content:  Logical  Suicidal Thoughts:  No  Homicidal Thoughts:  No  Memory:  Immediate;   Fair Recent;   Fair  Judgement:  Fair  Insight:  Fair  Psychomotor Activity:   Normal  Concentration:  Concentration: Fair  Recall:  [employer] of Knowledge:Fair  Language: Fair  Akathisia:  No  Handed:  Right  AIMS (if indicated):    Assets:  Communication Skills Desire for Improvement Social Support  ADL's:  Intact  Cognition: WNL  Sleep:  Fair   Screenings:   Assessment and Plan:  Patient to start Intensive Outpatient Programming Continue medications as directed -Stated she has been off her Lamictal and Klonopin for the past 3 days due to pharmacy issues.  NP to follow-up.  Treatment Plan was reviewed and agreed upon by NP T.Bobby Rumpf and patient Kristi Ewing need for group services      Derrill Center, NP 5/12/20218:37 AM

## 2019-10-04 NOTE — Progress Notes (Signed)
Virtual Visit via Video Note   I connected with [name] on 10/04/19 at  9:00 AM EDT by a video enabled telemedicine application and verified that I am speaking with the correct person using two identifiers.   Case Manager discussed the limitations of evaluation and management by telemedicine and the availability of in person appointments. The patient expressed understanding and agreed to proceed.   Location:  Patient: Patient Home Provider: [place] Office   History of Present Illness: MDD and GAD   Observations/Objective: Case Manager checked in with all participants to review discharge dates, insurance authorizations, work-related documents and needs for the treatment team. Counselor facilitated a check-in with group members to gauge mood and current functioning as well as identify recent progress towards treatment goals.  Shayle presented to group session on time and was alert, oriented x5, with no evidence or self-report of SI/HI or A/V H.  Whitlee reported scores of 4/10 for depression and 6-7/10 for anxiety today.  Thetis reported that yesterday went okay for her, as she attended a graduation party for her boyfriend, stating "It was alright, I was a little anxious with crowds after COVID though".  She reported that she has been out of work for a week now but will return today, and has some anxiety related to this, as she experiences fear and pressure when thinking about responsibilities required of her in current position.  Lorae reported that she hopes to build more coping skills and increase resilience so that she can be more calm and effective in this setting with less fear of the unknown each day.  She spoke with RN about having medications refilled.    Counselor introduced topic of building social support network today.  Counselor explained how this can be defined as having a having a group of healthy people in one's life you can talk to, spend time with, and get  help from to improve both mental and physical health.  Counselor noted that some barriers can make it difficult to connect with other people, including the presence of anxiety or depression, or moving to an unfamiliar area.  Group members were asked to assess the current state of their support network, and identify ways that this could be improved.  Tips were given on how to address previously noted barriers, such as strengthening social skills, using relaxation techniques to reduce anxiety, scheduling social time each week, and/or exploring social events nearby which could increase chances of meeting new supports.  Members were also encouraged to consider getting closer to people they already know through suggestions such as outreaching someone by text, email or phone call if they haven't spoken in awhile, doing something nice for a friend/family member unexpectedly, and/or inviting someone over for a game/movie/dinner night. Interventions were effective, as evidenced by Noah Delaine participating in discussion on the subject and reporting that she perceives a support network as being composed of people that you can reach out to when nearing a crisis to intervene and de-escalate.  Ameila reported that she worries about rejection from other people when she considers speaking about her feelings, as she has a particular friend that can be 'fairweather' and unavailable when she really needs them.  Declynn reported that she has addressed this issue directly and pointed out the pattern, but this person has shrugged it off as coincidence, so Eldana is more prone to isolation as a result.  She stated "Some people don't know friends or family with mental illness and aren't sure how  to deal with it".  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  She  reported that her experience with IOP so far has been positive and stated "I really like having this group now.  It makes me feel more normal".        Assessment and Plan: Clinician recommends that patient remains in IOP treatment to better manage mental health symptoms and continue to address treatment plan goals. Clinician recommends adherence to crisis/safety plan, taking medications as prescribed, and following up with medical professionals if any issues arise.   Follow Up Instructions: Clinician will send Webex link for next session. The patient was advised to call back or seek an in-person evaluation if the symptoms worsen or if the condition fails to improve as anticipated.     I provided 180 minutes of non-face-to-face time during this encounter.     Noralee Stain, LCSW, LCAS

## 2019-10-05 ENCOUNTER — Other Ambulatory Visit (HOSPITAL_COMMUNITY): Payer: No Typology Code available for payment source | Admitting: Licensed Clinical Social Worker

## 2019-10-05 ENCOUNTER — Other Ambulatory Visit: Payer: Self-pay

## 2019-10-05 DIAGNOSIS — F411 Generalized anxiety disorder: Secondary | ICD-10-CM

## 2019-10-05 DIAGNOSIS — F329 Major depressive disorder, single episode, unspecified: Secondary | ICD-10-CM | POA: Diagnosis not present

## 2019-10-05 DIAGNOSIS — F322 Major depressive disorder, single episode, severe without psychotic features: Secondary | ICD-10-CM

## 2019-10-05 NOTE — Progress Notes (Signed)
Virtual Visit via Video Note  I connected with Kristi Ewing on 10/05/19 at  9:00 AM EDT by a video enabled telemedicine application and verified that I am speaking with the correct person using two identifiers.   Case Manager discussed the limitations of evaluation and management by telemedicine and the availability of in person appointments. The patient expressed understanding and agreed to proceed.  History of Present Illness: MDD and GAD  Observations/Objective: Case Manager checked in with all participants to review discharge dates, insurance authorizations, work-related documents and needs for the treatment team. Clinician facilitated a check-in with group members to assess mood and current functioning. Clinician introduced self and prompted client to provide an update on functioning since last group session. Client checked in by providing an update and reported that she returned to work yesterday following group and was extremely overwhelmed prior to beginning her shift. Client reported that once she began working, it was incredibly difficult and she is hopeful that she will re-adjust to being in her work environment again soon. Client denied any current SI/HI/psychosis. Clinician finished check in by providing grounding exercise/guided meditation on physical and emotional healing and encouraged members to practice mindfulness in noticing their mind wondering and bringing it back to the present moment.  Group discussion was provided by [facility] with [facility]. Alex introduced self her role with joining the IOP group to provide psycho-education on specific services and programs offered within [facility]. Alex allowed space for members to ask questions and inquire on specific programs that they are interested in. Clinician followed up with members following Alex's presentation and facilitated discussion on which programs they plan to look into or inquire on. Clinician  assessed for 1 self-care activity prior to tomorrow's group. Client did not identify a specific program that she is interested in with mental [place] however actively engaged in check in by reporting that she plans to take care of herself by "taking a hot shower, and eating a good mea" after finishing work this evening.   Assessment and Plan: Clinician recommends that patient remains in IOP treatment to better manage mental health symptoms and continue to address treatment plan goals. Clinician recommends adherence to crisis/safety plan, taking medications as prescribed, and following up with medical professionals if any issues arise.  Follow Up Instructions: Clinician will send Webex link for next session. The patient was advised to call back or seek an in-person evaluation if the symptoms worsen or if the condition fails to improve as anticipated.   I provided 180 minutes of non-face-to-face time during this encounter.   Renee Harder, LCSW

## 2019-10-06 ENCOUNTER — Other Ambulatory Visit: Payer: Self-pay

## 2019-10-06 ENCOUNTER — Other Ambulatory Visit (HOSPITAL_COMMUNITY): Payer: No Typology Code available for payment source | Admitting: Licensed Clinical Social Worker

## 2019-10-06 DIAGNOSIS — F411 Generalized anxiety disorder: Secondary | ICD-10-CM

## 2019-10-06 DIAGNOSIS — F322 Major depressive disorder, single episode, severe without psychotic features: Secondary | ICD-10-CM

## 2019-10-06 DIAGNOSIS — F329 Major depressive disorder, single episode, unspecified: Secondary | ICD-10-CM | POA: Diagnosis not present

## 2019-10-06 NOTE — Progress Notes (Signed)
Virtual Visit via Video Note   I connected with Kristi Ewing on 10/06/19 at  9:00 AM EDT by a video enabled telemedicine application and verified that I am speaking with the correct person using two identifiers.   Case Manager discussed the limitations of evaluation and management by telemedicine and the availability of in person appointments. The patient expressed understanding and agreed to proceed.   Location:  Patient: Patient Home Provider: [place] Office   History of Present Illness: MDD and GAD   Observations/Objective: Case Manager checked in with all participants to review discharge dates, insurance authorizations, work-related documents and needs for the treatment team. Counselor facilitated a check-in with group members to gauge mood and current functioning as well as identify recent progress towards treatment goals.  Kristi Ewing presented to group session on time and was alert, oriented x5, with no evidence or self-report of SI/HI or A/V H.  Kristi Ewing reported scores of 1-2/10 for both depression and anxiety today.  Kristi Ewing reported that she woke up feeling sick and nauseous today and stated "I think something might be going around.  I can say that I'm feeling mentally better.  My meds seem to be working better".  Kristi Ewing reported that she was grateful for assistance from nurse with getting her Lamictal and Klonopin, and feels that this helped her cope with going back to work with decreased stress.  Kristi Ewing reported that her plan today will be to hang out with her boyfriend later playing a video game.  She reported that she would try to stay in session as long as possible today, but due to vomiting and cramps, she would not push herself and make her condition worse.    Counselor introduced topic of creating mental health maintenance plan today.  Counselor provided handout on subject to members, which stressed the importance of maintaining one's mental health in a similar way to  using diet and exercise to ensure physical health.  Counselor walked members through process of identifying triggers which could worsen symptoms, including specific people, places, and things one needs to avoid.  Members were also tasked with identifying warning signs such as thoughts, feelings, or behaviors which could indicate mental health is at increased risk.  Counselor also facilitated conversation on self-care activities and coping strategies which members have previously utilized in the past, are currently using in daily routine, or plan to use soon to assist with managing problems or symptoms when/if they appear.  Counselor encouraged members to revisit their maintenance plan often and make changes as needed to ensure day to day stability.  Intervention effectiveness could not be measured, as Kristi Ewing informed case manager at break that she could not remain for remainder of session due to symptoms she was experiencing.  She was encouraged to get some rest, and seek medical assistance if condition worsened.     Assessment and Plan: Clinician recommends that patient remains in IOP treatment to better manage mental health symptoms and continue to address treatment plan goals. Clinician recommends adherence to crisis/safety plan, taking medications as prescribed, and following up with medical professionals if any issues arise.   Follow Up Instructions: Clinician will send Webex link for next session. The patient was advised to call back or seek an in-person evaluation if the symptoms worsen or if the condition fails to improve as anticipated.     I provided 120 minutes of non-face-to-face time during this encounter.     Shade Flood, LCSW, LCAS

## 2019-10-09 ENCOUNTER — Other Ambulatory Visit: Payer: Self-pay

## 2019-10-09 ENCOUNTER — Other Ambulatory Visit (HOSPITAL_COMMUNITY): Payer: No Typology Code available for payment source | Admitting: Licensed Clinical Social Worker

## 2019-10-09 DIAGNOSIS — F329 Major depressive disorder, single episode, unspecified: Secondary | ICD-10-CM | POA: Diagnosis not present

## 2019-10-09 DIAGNOSIS — F322 Major depressive disorder, single episode, severe without psychotic features: Secondary | ICD-10-CM

## 2019-10-09 DIAGNOSIS — F411 Generalized anxiety disorder: Secondary | ICD-10-CM

## 2019-10-09 NOTE — Progress Notes (Signed)
Virtual Visit via Video Note   I connected with Kristi Ewing on 10/09/19 at  9:00 AM EDT by a video enabled telemedicine application and verified that I am speaking with the correct person using two identifiers.   Case Manager discussed the limitations of evaluation and management by telemedicine and the availability of in person appointments. The patient expressed understanding and agreed to proceed.   Location:  Patient: Patient Home Provider: [facility]   History of Present Illness: MDD and GAD   Observations/Objective: Case Manager checked in with all participants to review discharge dates, insurance authorizations, work-related documents and needs for the treatment team. Counselor facilitated a check-in with group members to gauge mood and current functioning as well as identify recent progress towards treatment goals.  Kristi Ewing presented to group session on time and was alert, oriented x5, with no evidence or self-report of SI/HI or A/V H.  Kristi Ewing reported scores of 2-3/10 for depression and 0/10 for anxiety today.  Kristi Ewing reported that she has had a difficult transition back to work, as she experienced a panic attack during her shift the other day and had to excuse herself to cry, as well as talk to her father for support.  Kristi Ewing reported that she was also not feeling well over the weekend due to her stomach illness, so this may have heightened anxiety.  Kristi Ewing reported that she spent the weekend engaged in self-care such as playing video games and relaxing with her boyfriend to de-stress.  She reported that her plan this weekend is to continue working to prioritize self-care to offset stress from work, and visit with her grandparents soon, as they tend to be very supportive in difficult times.      Counselor introduced topic of anger management today.  Counselor shared a handout with members on this subject featuring a variety of coping skills, and facilitated  discussion on these approaches.  Examples included raising awareness of anger triggers, practicing deep breathing, keeping an anger log to better understand episodes, using diversion activities to distract oneself for 30 minutes, taking a time out when necessary, and being mindful of warning signs tied to thoughts or behavior.  Counselor inquired about which techniques group members have used before, what has proved to be helpful, what their unique warning signs might be, as well as what they will try out in the future to assist with de-escalation.  Intervention was effective, as evidenced by Kristi Ewing engaging in discussion on the subject and reporting that she tends to become more susceptible to anger when she is feeling overwhelmed.  Kristi Ewing reported that this is most prominent when stress is high at work, and some of her warning signs include uncontrollable crying episodes where she has to stop what she is doing and "Let hot angry tears flow".  Kristi Ewing reported that she could relate to the concept of an 'anger iceberg' which underlying emotions below the surface heightening her anger such as sadness and anxiety.  She reported that she has been coping with anger by processing difficult feelings with people in her support system, asking for hugs to comfort her, taking time outs when necessary to calm down, and playing video games that provide at outlet for whatever emotions are most prominent.     Assessment and Plan: Clinician recommends that patient remains in IOP treatment to better manage mental health symptoms and continue to address treatment plan goals. Clinician recommends adherence to crisis/safety plan, taking medications as prescribed, and following up with medical  professionals if any issues arise.   Follow Up Instructions: Clinician will send Webex link for next session. The patient was advised to call back or seek an in-person evaluation if the symptoms worsen or if the condition fails to  improve as anticipated.     I provided 180 minutes of non-face-to-face time during this encounter.     Shade Flood, LCSW, LCAS

## 2019-10-10 ENCOUNTER — Other Ambulatory Visit: Payer: Self-pay

## 2019-10-10 ENCOUNTER — Other Ambulatory Visit (HOSPITAL_COMMUNITY): Payer: No Typology Code available for payment source | Admitting: Licensed Clinical Social Worker

## 2019-10-10 DIAGNOSIS — F322 Major depressive disorder, single episode, severe without psychotic features: Secondary | ICD-10-CM

## 2019-10-10 DIAGNOSIS — F411 Generalized anxiety disorder: Secondary | ICD-10-CM

## 2019-10-10 DIAGNOSIS — F329 Major depressive disorder, single episode, unspecified: Secondary | ICD-10-CM | POA: Diagnosis not present

## 2019-10-11 ENCOUNTER — Other Ambulatory Visit: Payer: Self-pay

## 2019-10-11 ENCOUNTER — Other Ambulatory Visit (HOSPITAL_COMMUNITY): Payer: No Typology Code available for payment source

## 2019-10-11 NOTE — Progress Notes (Signed)
Virtual Visit via Video Note  I connected with Kristi Ewing on 10/11/19 at  9:00 AM EDT by a video enabled telemedicine application and verified that I am speaking with the correct person using two identifiers.   Case Manager discussed the limitations of evaluation and management by telemedicine and the availability of in person appointments. The patient expressed understanding and agreed to proceed.  Location:  Patient: Patient Home Provider: [facility] [facility]  History of Present Illness: MDD and GAD  Observations/Objective: Case Manager checked in with all participants to review discharge dates, insurance authorizations, work-related documents and needs for the treatment team. Clinician facilitated a check-in with group members to assess mood and current functioning. Clinician introduced self and prompted client to provide an update on functioning since last group session. Client actively engaged in check in reporting that she has continued to struggle with stress related to work and experienced a panic attack last Friday while getting ready to go to work for her scheduled shift. Client reported that she began crying and called her father who then called her mother to come and offer support in the moment. Client relayed that she took a walk with her mother which distracted her from identified stress/anxiety however she did not go to work that evening.    Psycho-educational portion of group was centered on the impact language has on thoughts, feelings, behaviors. Clinician introduced video titled 'Happy Brain: How to Overcome Our Neural Predispositions to Suffering'. Clinician prompted members to journal on their responses to the video and then share with the group. Client provided feedback in relating to a peer in her typical negative thinking patterns. Client relayed feeling validated by the video in hearing that this is something that others struggle with as well.  Clinician shared resource  handout titled 'Pain to Power Vocabulary" and joined members in reviewing examples in which negative language can be reframed to empower the individual. Clinician praised client's participation and inquired as to any examples on the handout resonated with client. Clinician then proceeded with checkout and assessed for 1 self-care activity prior to tomorrow's group. Client did not engage in discussion related to psycho-educational handout due to taking a call from her primary therapist. Client did not identify a self-care activity during check out.  Assessment and Plan: Clinician recommends that patient remains in IOP treatment to better manage mental health symptoms and continue to address treatment plan goals. Clinician recommends adherence to crisis/safety plan, taking medications as prescribed, and following up with medical professionals if any issues arise.  Follow Up Instructions: Clinician will send Webex link for next session. The patient was advised to call back or seek an in-person evaluation if the symptoms worsen or if the condition fails to improve as anticipated.  I provided 180 minutes of non-face-to-face time during this encounter.   Renee Harder, LCSW

## 2019-10-12 ENCOUNTER — Other Ambulatory Visit (HOSPITAL_COMMUNITY): Payer: No Typology Code available for payment source | Admitting: Licensed Clinical Social Worker

## 2019-10-12 ENCOUNTER — Other Ambulatory Visit: Payer: Self-pay

## 2019-10-12 DIAGNOSIS — F411 Generalized anxiety disorder: Secondary | ICD-10-CM

## 2019-10-12 DIAGNOSIS — F329 Major depressive disorder, single episode, unspecified: Secondary | ICD-10-CM | POA: Diagnosis not present

## 2019-10-12 DIAGNOSIS — F322 Major depressive disorder, single episode, severe without psychotic features: Secondary | ICD-10-CM

## 2019-10-13 ENCOUNTER — Other Ambulatory Visit (HOSPITAL_COMMUNITY): Payer: No Typology Code available for payment source | Admitting: Licensed Clinical Social Worker

## 2019-10-13 ENCOUNTER — Other Ambulatory Visit: Payer: Self-pay

## 2019-10-13 DIAGNOSIS — F411 Generalized anxiety disorder: Secondary | ICD-10-CM

## 2019-10-13 DIAGNOSIS — F322 Major depressive disorder, single episode, severe without psychotic features: Secondary | ICD-10-CM

## 2019-10-13 DIAGNOSIS — F329 Major depressive disorder, single episode, unspecified: Secondary | ICD-10-CM | POA: Diagnosis not present

## 2019-10-13 NOTE — Progress Notes (Signed)
Virtual Visit via Video Note   I connected with Kristi Ewing on 10/13/19 at  9:00 AM EDT by a video enabled telemedicine application and verified that I am speaking with the correct person using two identifiers.   Case Manager discussed the limitations of evaluation and management by telemedicine and the availability of in person appointments. The patient expressed understanding and agreed to proceed.   Location:  Patient: Patient Home Provider: [facility]   History of Present Illness: MDD and GAD   Observations/Objective: Case Manager checked in with all participants to review discharge dates, insurance authorizations, work-related documents and needs for the treatment team. Counselor facilitated a check-in with group members to gauge mood and current functioning as well as identify recent progress towards treatment goals.  Kristi Ewing presented to group session on time and was alert, oriented x5, with no evidence or self-report of SI/HI or A/V H.  Kristi Ewing reported scores of 1/10 for depression and 5-6/10 for anxiety today.  Kristi Ewing reported that she spent the day before working and stated "My schedule is busy enough that I haven't been getting in my negative thoughts".  She also reported getting a chance to see her father, then sat outside later and relaxed with her dog and duck while listening to music to relax.  Kristi Ewing reported that her plan is to work later today to get back into routine, spend time with her boyfriend later tonight, and possibly go see a movie with her mother.    Counselor provided psychoeducation on personal boundaries today.  Counselor provided worksheet to members on this subject which explained how boundaries can be considered limits and rules we set for ourselves in relationships, and involves the ability to say "No" to unreasonable demands. Suggestions for improving personal boundaries were discussed, including how to use confident body language, being  respectful to others, planning ahead regarding how to approach difficult conversations, and learning to reach a compromise when possible.  Members were encouraged to consider which traits they most often display, and discussion centered upon how this impacts interactions with supports, and whether changes could be made to improve relationships.  Members were also given opportunities to practice responses they could provide when faced with difficult situations (i.e. being asked to do something at a late hour when inconvenient, having a nosy coworker ask personal questions about work leave, etc).  Interventions were effective, as evidenced by Kristi Ewing participating in discussion on the subject and reporting that she has "Pretty poor boundaries with my parents".  Kristi Ewing reported that when she is feeling overwhelmed, she has a habit of seeking help from her parents, grandparents, and therapist, stating "When I'm in a panic, I look for people to rescue me.  My boyfriend is becoming that way too, and I need to learn to do this on my own.  I'm a grown woman".  Kristi Ewing reported that this pattern has led her to feel guilty and like a burden, so she intends to build up more effective coping skills and self-care routine to take some pressure off her supports. She was able to provide effective response in exercise requiring her to say "No" to excessive demands, and noted that her boundaries at work tend to be more rigid.       Assessment and Plan: Clinician recommends that patient remains in IOP treatment to better manage mental health symptoms and continue to address treatment plan goals. Clinician recommends adherence to crisis/safety plan, taking medications as prescribed, and following up with medical  professionals if any issues arise.   Follow Up Instructions: Clinician will send Webex link for next session. The patient was advised to call back or seek an in-person evaluation if the symptoms worsen or if the  condition fails to improve as anticipated.     I provided 180 minutes of non-face-to-face time during this encounter.     Shade Flood, LCSW, LCAS

## 2019-10-16 ENCOUNTER — Other Ambulatory Visit: Payer: Self-pay

## 2019-10-16 ENCOUNTER — Other Ambulatory Visit (HOSPITAL_COMMUNITY): Payer: No Typology Code available for payment source | Admitting: Licensed Clinical Social Worker

## 2019-10-16 DIAGNOSIS — F322 Major depressive disorder, single episode, severe without psychotic features: Secondary | ICD-10-CM

## 2019-10-16 DIAGNOSIS — F329 Major depressive disorder, single episode, unspecified: Secondary | ICD-10-CM | POA: Diagnosis not present

## 2019-10-16 DIAGNOSIS — F411 Generalized anxiety disorder: Secondary | ICD-10-CM

## 2019-10-16 NOTE — Progress Notes (Signed)
Virtual Visit via Video Note   I connected with Kristi Ewing on 10/16/19 at  9:25 AM EDT by a video enabled telemedicine application and verified that I am speaking with the correct person using two identifiers.   Case Manager discussed the limitations of evaluation and management by telemedicine and the availability of in person appointments. The patient expressed understanding and agreed to proceed.   Location:  Patient: Patient Home Provider: [facility]   History of Present Illness: MDD and GAD   Observations/Objective: Case Manager checked in with all participants to review discharge dates, insurance authorizations, work-related documents and needs for the treatment team. Counselor facilitated a check-in with group members to gauge mood and current functioning as well as identify recent progress towards treatment goals.  Kamariah presented to group session 25 minutes late and reported that she was having difficulty connecting to session this morning.  She presented as alert, oriented x5, with no evidence or self-report of SI/HI or A/V H.  Trenee reported scores of 10/10 for depression and 8-9/10 for anxiety today.  Sarina reported that she did not have a good weekend and did not do much, as she learned that her brother that has been living with her for roughly 2-3 years would be moving out, so she is sad she won't have him close by to help anymore.  Janith reported that "It feels like I'm losing my best friend", but was able to find some relief in session by expressing her feelings, and gaining supportive feedback from peers.  Romell reported that she is scheduled to go to work today, but will need to consider her emotional state beforehand to determine if she can handle the stress.    Counselor covered topic of core beliefs with group today.  Counselor provided handout on the subject, which explained how everyone looks at the world differently, and two people can  have the same experience, but have different interpretations of what happened.  Members were encouraged to think of these like sunglasses with different "shades" influencing perception towards positive or negative outcomes.  Examples of negative core beliefs were provided, such as "I'm unlovable", "I'm not good enough", and "I'm a bad person".  Members were asked to identify which one(s) they could relate to, and then consider evidence which contradicts these beliefs.  Aleane reported that she could relate to 'person A' in the example provided in the handout, as she tends to look at things through a negative lens, and relies on other people to give her alternative perspectives much of the time.  Mata reported that some of her beliefs include the idea that she is 'stuck' at this unfavorable spot in her life at the moment, and can be a burden to others by asking for help.  Jisel reported that todays session was helpful for disrupting this negative thought cycle, improving outlook, and expressed gratitude for group members empathy and understanding displayed.    Counselor also provided psychoeducation on positive affirmations today.  Counselor explained how these are positive statements which can be spoken out loud or recited mentally to challenge negative thoughts and/or core beliefs to improve mood and outlook each day.  Counselor provided a comprehensive list of affirmations to members with different categories, including ones for health, confidence, success, and happiness.  Counselor invited members to look through this list and identify any which resonated with them, and practice saying them out loud with sincerity.  Intervention effectiveness could not be measured, as Coralee noted that  she was having connectivity issues towards the end of group and had to disconnect early at 11:35am.  She informed both counselor and case manager via email and agreed to follow up tomorrow in next session.      Assessment and Plan: Clinician recommends that patient remains in IOP treatment to better manage mental health symptoms and continue to address treatment plan goals. Clinician recommends adherence to crisis/safety plan, taking medications as prescribed, and following up with medical professionals if any issues arise.   Follow Up Instructions: Clinician will send Webex link for next session. The patient was advised to call back or seek an in-person evaluation if the symptoms worsen or if the condition fails to improve as anticipated.    I provided 130 minutes of non-face-to-face time during this encounter.     Shade Flood, LCSW, LCAS

## 2019-10-16 NOTE — Progress Notes (Signed)
Virtual Visit via Video Note  I connected with Kristi Ewing on 10/12/2019 at  9:00 AM EDT by a video enabled telemedicine application and verified that I am speaking with the correct person using two identifiers.   Case Manager discussed the limitations of evaluation and management by telemedicine and the availability of in person appointments. The patient expressed understanding and agreed to proceed.  Location:  Patient: Patient Home Provider: [facility]  History of Present Illness: MDD and GAD  Observations/Objective: Case Manager checked in with all participants to review discharge dates, insurance authorizations, work-related documents and needs for the treatment team. Clinician facilitated a check-in with group members to assess mood and current functioning. Clinician introduced self and prompted client to provide an update on functioning since last group meeting. Client was active throughout check in and provided an update reporting "things have been OK" and she has continued adjusting to her work schedule and routine. Client relayed that her current medication regiment has caused her to feel tired throughout the day and discussed her plans to meet with her psychiatrist to inform of him of these concerns. Client reported that overall she feels has is "doing better". Client did not endorse any SI/HI/psychosis during this engagement.  Initial portion of group was provided by [facility], Rolin Barry. Chaplain introduced self her role with joining the IOP group 1x weekly. Chaplain provided education on various types of grief such as anticipatory and disenfranchised grief. Chaplain provided information related to stages of grief and encouraged members to share their personal experiences with grief/loss. Client processed her experience with grief in losing an uncle. Client related to others throughout processing and provided feedback and encouragement to  peers.  Psycho-educational portion of group centered on identifying and relaying needs to others in order to feel supported. Clinician introduced topic and assessed member's ability to relay their needs to others and any potential barriers in doing so. Clinician prompted discussion on the value in relaying needs to others to improve relationships and mood. Clinician shared video titled 'What is Your Love Language' and encouraged members to share their responses. Clinician shared a Love Language quiz both virtually and a handout to group members. Clinician encouraged members to take quiz and then share responses. Clinician utilized open ended questions and CBT to challenge member's thoughts regarding their responses as well as how they can inform others on how to best support them. Client reported that her love language is physical touch and described how various types of touch make her feel supported or loved. Client relayed that she typically shows love through touch as well however tries to be mindful of others' comfort. Client reported that she also enjoys acts of service and identified several things that her boyfriend does for her to provide support.  Assessment and Plan: Clinician recommends that patient remains in IOP treatment to better manage mental health symptoms and continue to address treatment plan goals. Clinician recommends adherence to crisis/safety plan, taking medications as prescribed, and following up with medical professionals if any issues arise.  Follow Up Instructions: Clinician will send Webex link for next session. The patient was advised to call back or seek an in-person evaluation if the symptoms worsen or if the condition fails to improve as anticipated.  I provided 150 minutes of non-face-to-face time during this encounter.   Renee Harder, LCSW

## 2019-10-17 ENCOUNTER — Other Ambulatory Visit (HOSPITAL_COMMUNITY): Payer: No Typology Code available for payment source | Admitting: Licensed Clinical Social Worker

## 2019-10-17 ENCOUNTER — Ambulatory Visit (HOSPITAL_COMMUNITY): Payer: No Typology Code available for payment source

## 2019-10-17 ENCOUNTER — Other Ambulatory Visit: Payer: Self-pay

## 2019-10-17 DIAGNOSIS — F329 Major depressive disorder, single episode, unspecified: Secondary | ICD-10-CM | POA: Diagnosis not present

## 2019-10-17 DIAGNOSIS — F322 Major depressive disorder, single episode, severe without psychotic features: Secondary | ICD-10-CM

## 2019-10-17 DIAGNOSIS — F411 Generalized anxiety disorder: Secondary | ICD-10-CM

## 2019-10-17 NOTE — Progress Notes (Signed)
Virtual Visit via Video Note  I connected with Kristi Ewing on 10/17/19 at  9:00 AM EDT by a video enabled telemedicine application and verified that I am speaking with the correct person using two identifiers.   Case Manager discussed the limitations of evaluation and management by telemedicine and the availability of in person appointments. The patient expressed understanding and agreed to proceed.  Location:  Patient: Patient Home Provider: [place] [facility]  History of Present Illness: MDD and GAD  Observations/Objective: Case Manager checked in with all participants to review discharge dates, insurance authorizations, work-related documents and needs for the treatment team. Clinician facilitated a check-in with group members to assess mood and current functioning. Clinician introduced self and prompted client to provide an update on functioning since last group meeting. Clinician shared guided meditation 'Clear Your Mind' and encouraged members to engage in mindfulness as well as provide feedback on their experience. Client was active throughout check in and provided an update reporting that that yesterday's group topic was particularly heavy which she reports "led to me having a panic attack". Client reported sx were so intense that she called her parents apologizing for being a failure and ended up calling out of work. Client reported receiving positive feedback on her progress from her parents and admitted "I know that progress isn't linear". Client did not endorse SI/HI/psychosis during this engagement.  Psycho-educational portion of group centered on Cognitive Distortions and unhealthy thinking patterns. Clinician provided oversight on distorted thinking patters and shared video 'Cognitive Distortion's and allowed space for members to share their responses. Clinician provided psycho-educational handouts titled 'Cognitive Distortions' and 'Challenging Negative Thoughts' and prompted  members to identify any distorted thinking patterns that they engage in while processing feelings associated with those thoughts as well as behaviors. Clinician validated members feelings and engaged in discussion on the importance in recognizing these thoughts in order to make change. Client relayed identifying with catastrophizing frequently and reported difficulty noticing these thoughts in the moment. Client admitted that it is important to be aware of these thoughts so that she can challenge them to improve mood and bx.  Assessment and Plan: Clinician recommends that patient remains in IOP treatment to better manage mental health symptoms and continue to address treatment plan goals. Clinician recommends adherence to crisis/safety plan, taking medications as prescribed, and following up with medical professionals if any issues arise.  Follow Up Instructions: Clinician will send Webex link for next session. The patient was advised to call back or seek an in-person evaluation if the symptoms worsen or if the condition fails to improve as anticipated.   I provided 180 minutes of non-face-to-face time during this encounter.   Renee Harder, LCSW

## 2019-10-18 ENCOUNTER — Telehealth (HOSPITAL_COMMUNITY): Payer: Self-pay | Admitting: Psychiatry

## 2019-10-18 ENCOUNTER — Other Ambulatory Visit (HOSPITAL_COMMUNITY): Payer: No Typology Code available for payment source | Admitting: Psychiatry

## 2019-10-18 ENCOUNTER — Other Ambulatory Visit: Payer: Self-pay

## 2019-10-18 ENCOUNTER — Ambulatory Visit (HOSPITAL_COMMUNITY): Payer: No Typology Code available for payment source

## 2019-10-19 ENCOUNTER — Other Ambulatory Visit (HOSPITAL_COMMUNITY): Payer: No Typology Code available for payment source | Admitting: Licensed Clinical Social Worker

## 2019-10-19 ENCOUNTER — Other Ambulatory Visit: Payer: Self-pay

## 2019-10-19 DIAGNOSIS — F322 Major depressive disorder, single episode, severe without psychotic features: Secondary | ICD-10-CM

## 2019-10-19 DIAGNOSIS — F329 Major depressive disorder, single episode, unspecified: Secondary | ICD-10-CM | POA: Diagnosis not present

## 2019-10-19 DIAGNOSIS — F411 Generalized anxiety disorder: Secondary | ICD-10-CM

## 2019-10-20 ENCOUNTER — Ambulatory Visit (HOSPITAL_COMMUNITY): Payer: No Typology Code available for payment source

## 2019-10-20 ENCOUNTER — Other Ambulatory Visit: Payer: Self-pay

## 2019-10-20 ENCOUNTER — Other Ambulatory Visit (HOSPITAL_COMMUNITY): Payer: No Typology Code available for payment source | Admitting: Licensed Clinical Social Worker

## 2019-10-20 DIAGNOSIS — F329 Major depressive disorder, single episode, unspecified: Secondary | ICD-10-CM | POA: Diagnosis not present

## 2019-10-20 DIAGNOSIS — F411 Generalized anxiety disorder: Secondary | ICD-10-CM

## 2019-10-20 DIAGNOSIS — F322 Major depressive disorder, single episode, severe without psychotic features: Secondary | ICD-10-CM

## 2019-10-20 NOTE — Progress Notes (Signed)
Virtual Visit via Video Note   I connected with Kristi Ewing on 10/20/19 at  9:00 AM EDT by a video enabled telemedicine application and verified that I am speaking with the correct person using two identifiers.   Case Manager discussed the limitations of evaluation and management by telemedicine and the availability of in person appointments. The patient expressed understanding and agreed to proceed.   Location:  Patient: Patient Home Provider: [facility]   History of Present Illness: MDD and GAD   Observations/Objective: Case Manager checked in with all participants to review discharge dates, insurance authorizations, work-related documents and needs for the treatment team. Counselor facilitated a check-in with group members to gauge mood and current functioning as well as identify recent progress towards treatment goals.  Kristi Ewing presented to group session on time and was alert, oriented x5, with no evidence or self-report of SI/HI or A/V H.  Kristi Ewing reported scores of 1/10 for both depression and anxiety today.  Kristi Ewing reported that she was feeling 'okay' today, as she was on call earlier this morning from 3am-7am, and has to work later this afternoon, but had an enjoyable day overall, walking her dog, and trying to keep her mind preoccupied.  She reported that she has not been sleeping very well lately, but is looking forward to the extended weekend as a chance to catch up on rest.  Kristi Ewing reported that if she is able to recharge, she will hang out with her boyfriend over the weekend, visit the science center, as well as celebrate memorial day with her family.    Counselor introduced topic of self-esteem today and defined this as the value an individual places on oneself, based upon assessment of personal worth as a human being and approval/disapproval of one's behavior. Counselor asked members to assess their level of self-esteem at this time based upon common indicators of  high self-esteem, including: accepting oneself unconditionally;  having self-respect and deep seated belief that one matters; being unaffected by other people's opinions/criticisms; and showing good control over emotions.  Counselor also explained concept of one's inner critic which serves to highlight faults and minimize strengths, directly influencing low sense of self-esteem.  Kristi Ewing reported that she defines self-esteem as "Being good at your job, and getting praise from your coworkers for hard work".  Kristi Ewing reported that even though she regularly gets compliments for going above and beyond in her position, it "Doesn't feel real" to her, and she tends to be filled with self-doubt.  Kristi Ewing reported that she finds it particularly overwhelming prior to long shifts, as she worries how she will handle the unknown crises that might arise each day.   Counselor then provided handout on 'strengths and qualities', which featured questions to guide discussion and increase awareness of each member's unique individual abilities which could reinforce higher self-esteem. Examples of questions included: 'things I am good at', 'challenges I have overcome', and 'what I like about myself'.  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  Kristi Ewing reported that this was especially apparent yesterday when the system at work went down while on shift, but she was still able to accomplish tasks and coordinate with staff, stating "We got Cheer awards from staff for doing such a good job".  Kristi Ewing reported that her goal is to become increasingly more confident in her abilities and stated "  Validation is important, but I need to stay motivated and do things on my own too".      Counselor ended session by acknowledging a graduating group member by prompting graduating member to reflect on progress  made, takeaways from treatment and plan for stepping down. Counselor and group members shared observations of growth, encouragement and support as they transition out of the program.  Kristi Ewing reported that she appreciated the other group members time shared together, stating "You helped me come out of my shell and feel less alone.  I feel more hopeful, and have even been thinking about going to school again because its been inspiring".     Assessment and Plan: Clinician recommends that patient remains in IOP treatment to better manage mental health symptoms and continue to address treatment plan goals. Clinician recommends adherence to crisis/safety plan, taking medications as prescribed, and following up with medical professionals if any issues arise.   Follow Up Instructions: Clinician will send Webex link for next session. The patient was advised to call back or seek an in-person evaluation if the symptoms worsen or if the condition fails to improve as anticipated.     I provided 180 minutes of non-face-to-face time during this encounter.     Shade Flood, LCSW, LCAS

## 2019-10-24 ENCOUNTER — Encounter (HOSPITAL_COMMUNITY): Payer: Self-pay | Admitting: Family

## 2019-10-24 ENCOUNTER — Other Ambulatory Visit (HOSPITAL_COMMUNITY): Payer: No Typology Code available for payment source | Attending: Psychiatry | Admitting: Psychiatry

## 2019-10-24 ENCOUNTER — Other Ambulatory Visit: Payer: Self-pay

## 2019-10-24 ENCOUNTER — Other Ambulatory Visit (HOSPITAL_COMMUNITY): Payer: Self-pay | Admitting: Psychiatry

## 2019-10-24 DIAGNOSIS — F329 Major depressive disorder, single episode, unspecified: Secondary | ICD-10-CM | POA: Insufficient documentation

## 2019-10-24 DIAGNOSIS — F419 Anxiety disorder, unspecified: Secondary | ICD-10-CM | POA: Insufficient documentation

## 2019-10-24 DIAGNOSIS — F322 Major depressive disorder, single episode, severe without psychotic features: Secondary | ICD-10-CM

## 2019-10-24 MED FILL — VENLAFAXINE HCL ER 150 MG C: 150 | 90 days supply | Qty: 90 | Fill #0

## 2019-10-24 MED FILL — QUETIAPINE FUMARATE 200 MG: 200 | 90 days supply | Qty: 90 | Fill #0

## 2019-10-24 NOTE — Patient Instructions (Signed)
D:  Pt completed MH-IOP today.  A:  Discharge today.  F/U with Dr. Launa Flight on 10-27-19 @ 9 a.m and Nedra Hai (therapist) next week.  Encouraged support groups and [facility].  Pt will continue with Q-App.  R:  Pt receptive.

## 2019-10-24 NOTE — Progress Notes (Signed)
Virtual Visit via Video Note  I connected with [name] on 10/19/19 at  9:00 AM EDT by a video enabled telemedicine application and verified that I am speaking with the correct person using two identifiers.   Case Manager discussed the limitations of evaluation and management by telemedicine and the availability of in person appointments. The patient expressed understanding and agreed to proceed.  Location:  Patient: Patient Home Provider: [place] [facility]  History of Present Illness: MDD, GAD  Observations/Objective: Case Manager checked in with all participants to review discharge dates, insurance authorizations, work-related documents and needs for the treatment team. Clinician facilitated a check-in with group members to assess mood and current functioning. Clinician introduced self and prompted client to provide an update on functioning since last group session. Client checked in by providing an update and reported that she is feeling "overall ok". Client related to a peer throughout check in and provided feedback related to this peer's frustrations and validated her feelings throughout discussion. Client denied any current SI/HI/psychosis.  Initial portion of group was provided by [facility], Rolin Barry. Chaplain introduced self her role with joining the IOP group 1x weekly. Chaplain provided education on various types of grief such as anticipatory and disenfranchised grief. Chaplain provided information related to stages of grief and encouraged members to share their personal experiences with grief/loss.   Second portion of group discussion was provided by [facility] with [facility]. Alex introduced self her role with joining the IOP group to provide psycho-education on specific services and programs offered within [facility]. Alex allowed space for members to ask questions and inquire on specific programs that they are interested in. Clinician  followed up with members following Alex's presentation and facilitated discussion on which programs they plan to look into or inquire on. Clinician assessed for 1 self-care activity prior to tomorrow's group. Client lost connection during check out and was unable to provide her plans for self-care today.  Assessment and Plan: Clinician recommends that patient remains in IOP treatment to better manage mental health symptoms and continue to address treatment plan goals. Clinician recommends adherence to crisis/safety plan, taking medications as prescribed, and following up with medical professionals if any issues arise.  Follow Up Instructions: Clinician will send Webex link for next session. The patient was advised to call back or seek an in-person evaluation if the symptoms worsen or if the condition fails to improve as anticipated.   I provided 180 minutes of non-face-to-face time during this encounter.   Renee Harder, LCSW

## 2019-10-24 NOTE — Progress Notes (Signed)
°  Virtual Visit via Telephone Note  I connected with [name] on 10/24/19 at  9:00 AM EDT by telephone and verified that I am speaking with the correct person using two identifiers.   I discussed the limitations, risks, security and privacy concerns of performing an evaluation and management service by telephone and the availability of in person appointments. I also discussed with the patient that there may be a patient responsible charge related to this service. The patient expressed understanding and agreed to proceed.  I discussed the assessment and treatment plan with the patient. The patient was provided an opportunity to ask questions and all were answered. The patient agreed with the plan and demonstrated an understanding of the instructions.   The patient was advised to call back or seek an in-person evaluation if the symptoms worsen or if the condition fails to improve as anticipated.  I provided 15 minutes of non-face-to-face time during this encounter.   Derrill Center, NP   [facility] Intensive Outpatient Program Discharge Summary  Kristi Ewing [id number]  Admission date: 10/03/2019 Discharge date: 10/24/2019  Reason for admission: Per admission assessment note: Kristi Ewing is a 24 year old Caucasian female presents with worsening depression and anxiety.  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  Patient stated she is unable to identify any stressors and/or triggers as she states " my life have been going fairly well I recently bought a home."  Patient reports poor concentration, mood irritability and poor appetite.  Family of Origin Issues: Deborha continues to report that her family has been supportive overall.  States that her brother decided to move out on his  own in September, and she is dealing with his decision well. " I will miss him."  Reported new stressors related to animals farm animals and her duck was killed.  However overall is handling the situation well.  Reported utilizing coping skills that was learned in intensive outpatient programming for work-related stressors.  Progress in Program Toward Treatment Goals: Patient attended and participated with daily group session with active and engaged participation.  Reports feeling better overall since completing intensive outpatient programming.  Denying suicidal or homicidal ideations.  Denies auditory or visual hallucinations.  Patient reports a follow-up appointment with her attending psychiatrist on 6/4/202.  Patient is declining any medication refills at this time.  Support, encouragement and  reassurance was provided  Progress (rationale): Keep follow-up with Dr. Launa Flight and Nedra Hai (therapist).   Take all medications as prescribed. Keep all follow-up appointments as scheduled.  Do not consume alcohol or use illegal drugs while on prescription medications. Report any adverse effects from your medications to your primary care provider promptly.  In the event of recurrent symptoms or worsening symptoms, call 911, a crisis hotline, or go to the nearest emergency department for evaluation.   Derrill Center, NP 10/24/2019

## 2019-10-24 NOTE — Progress Notes (Signed)
Virtual Visit via Video Note  I connected with Kristi Ewing on @TODAY @ at  9:00 AM EDT by a video enabled telemedicine application and verified that I am speaking with the correct person using two identifiers. I discussed the limitations of evaluation and management by telemedicine and the availability of in person appointments. The patient expressed understanding and agreed to proceed. I discussed the assessment and treatment plan with the patient. The patient was provided an opportunity to ask questions and all were answered. The patient agreed with the plan and demonstrated an understanding of the instructions.  The patient was advised to call back or seek an in-person evaluation if the symptoms worsen or if the condition fails to improve as anticipated.  I provided 20 minutes of non-face-to-face time during this encounter.   Patient ID: Kristi Ewing, female   DOB: 29-Apr-1996, 24 y.o.   MRN: [id number] As per previous [facility] note states: This is a 24 yr old, single, employed, Caucasian female who was referred per therapist Nedra Hai; treatment for worsening anxiety and depression.  States she's been struggling with anxiety since age 54.  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  "I started this job last year and purchased a home also.  I just feel like I've done so much so fast and it's triggering my anxiety."  Pt denies any prior psychiatric hospitalizations.  Also, denies any suicide attempts or gestures.  Has been seeing Dr. Launa Flight for 2 months and Nedra Hai for ~ 1 yr.  Family hx:  Brother, M-GM, Mother (Anxiety); Deceased M-Uncle (Paranoid-Schizophrenic). Patients Currently Reported Symptoms/Problems: Panic attacks, sadness, isolative, poor appetite (has lost 15lbs within 1 1/2 months), decreased sleep, tearful, irritable, anhedonia, loss of motivation, no energy, fatigue, ruminating, racing thoughts.   Pt attended all 15  MH-IOP days.  Reports that her family is stating that they have noticed that her mood has improved.  States her brother will be moving out of the home they share in September.  Pt will be returning to her normal work schedule tomorrow at work (10 a.m-7 pm); states she is very anxious about that.  Case manager reiterated to pt, that she has a bag of tricks now for when she feels anxious at work.  Pt reports she has been checking in daily on the Q-App; which is helpful.  Denies SI/HI or A/V hallucinations.  A:  D/C today.  F/U with Dr. Launa Flight on 10-27-19 @ 9 a.m and Nedra Hai next week.  Encouraged support groups through [facility] [place].  Strongly recommend the continued use of Q-App daily.  Recommended the aftercare group with Romelle Starcher (group leader).  R:  Pt receptive.  Dellia Nims, M.Ed,CNA

## 2019-10-24 NOTE — Progress Notes (Signed)
Virtual Visit via Video Note  I connected with [name] on 10/24/19 at  9:00 AM EDT by a video enabled telemedicine application and verified that I am speaking with the correct person using two identifiers. Case Manager discussed the limitations of evaluation and management by telemedicine and the availability of in person appointments. The patient expressed understanding and agreed to proceed.   Location:  Patient: Patient Home Provider: [place] Office   History of Present Illness: MDD   Observations/Objective: Case Manager checked in with all participants to review discharge dates, insurance authorizations, work-related documents and needs for the treatment team. Counselor facilitated a check-in with group members to assess mood and current functioning. Client reports that she experienced a loss over the weekend which increased depressive symptoms. Client discussed having coping strategies used to combat her depressive symptoms. Client denied any current SI/HI/psychosis.    Initial Therapeutic Activity: Counselor prompted group members to reflect through journaling and then to share about what has been helpful in their group experience thus far and what topics they are interested in covering in upcoming sessions. Client shared that she appreciated the work on core beliefs and completing her personality inventory for better insight on self.   Second Therapeutic Activity: Counselor facilitated a Guided Automotive engineer from [employer], which incorporated deep breathing, visualizations, positive affirmations and meditation. Client participated well and reported that the experience was relaxing. The affirmation that resonated most was, "I forgive myself". She noted "beating self up" over small things and often being apologetic to others without warrant.   Counselor ended group by celebrating the Client as a graduating group member, prompting the client to share about progress made  in group and their plan for maintaining progress as she transitions from IOP. Client noted that she had a great overall experience, that she plans to connect with local resources to continue maintenance of mental health. Client about shared progress and encouraging words with remaining group members. Group members shared well wishes with the client.    Assessment and Plan: Clinician recommends that client steps down from IOP treatment to individual therapy to maintian mental health stability and to [place] treatment plan goals. Clinician recommends adherence to crisis/safety plan, taking medications as prescribed, and following up with medical professionals if any issues arise.   Follow Up Instructions: Clinician will send Webex link for next session. The client was advised to call back or seek an in-person evaluation if the symptoms worsen or if the condition fails to improve as anticipated.     I provided 180 minutes of non-face-to-face time during this encounter.     Lise Auer, LCSW

## 2019-11-04 ENCOUNTER — Encounter (HOSPITAL_COMMUNITY): Payer: Self-pay

## 2019-11-04 DIAGNOSIS — F411 Generalized anxiety disorder: Secondary | ICD-10-CM

## 2019-11-06 NOTE — BH Assessment (Signed)
Q-Actual   Writer attempted to contact the participant in order to complete a PHQ 2-9.  Writer was unsuccessful in reaching the patient and was only able to leave a voice mail message.

## 2019-11-09 ENCOUNTER — Encounter (HOSPITAL_COMMUNITY): Payer: Self-pay

## 2019-11-09 DIAGNOSIS — F322 Major depressive disorder, single episode, severe without psychotic features: Secondary | ICD-10-CM

## 2019-11-09 NOTE — BH Assessment (Signed)
Q-Actual   Writer attempted to contact the participant in order to complete a PHQ 2-9.  Writer was unsuccessful in reaching the patient and was only able to leave a voice mail message.

## 2019-11-13 ENCOUNTER — Other Ambulatory Visit (HOSPITAL_COMMUNITY): Payer: Self-pay | Admitting: Psychiatry

## 2019-11-13 MED FILL — VENLAFAXINE HCL ER 75 MG CA: 75 | 30 days supply | Qty: 30 | Fill #0

## 2019-11-23 MED FILL — CLONAZEPAM 1 MG TABS: 1 | 30 days supply | Qty: 90 | Fill #1

## 2019-11-24 ENCOUNTER — Other Ambulatory Visit (HOSPITAL_COMMUNITY): Payer: Self-pay | Admitting: Psychiatry

## 2019-11-24 MED FILL — SUBVENITE 200 MG TABS: 200 | 30 days supply | Qty: 30 | Fill #0

## 2019-11-28 ENCOUNTER — Encounter (HOSPITAL_COMMUNITY): Payer: Self-pay

## 2019-11-28 DIAGNOSIS — F322 Major depressive disorder, single episode, severe without psychotic features: Secondary | ICD-10-CM

## 2019-11-28 NOTE — BH Assessment (Signed)
Q-Actual   Writer attempted to contact the participant in order to complete a PHQ 2-9.  Writer was unsuccessful in reaching the patient.  Participant mother reports that her daughter is at work and she will call me back tomorrow.

## 2019-12-19 MED FILL — SUBVENITE 200 MG TABS: 200 | 30 days supply | Qty: 30 | Fill #1

## 2019-12-19 MED FILL — QUETIAPINE FUMARATE 300 MG: 300 | 90 days supply | Qty: 90 | Fill #0

## 2019-12-22 ENCOUNTER — Other Ambulatory Visit (HOSPITAL_COMMUNITY): Payer: Self-pay | Admitting: Psychiatry

## 2019-12-22 MED FILL — CLONAZEPAM 1 MG TABS: 1 | 30 days supply | Qty: 30 | Fill #0

## 2019-12-22 MED FILL — VENLAFAXINE HCL ER 75 MG CA: 75 | 30 days supply | Qty: 30 | Fill #1

## 2019-12-28 ENCOUNTER — Telehealth: Payer: Self-pay | Admitting: Family Medicine

## 2019-12-28 NOTE — Telephone Encounter (Signed)
error 

## 2020-01-17 MED FILL — SUBVENITE 200 MG TABS: 200 | 30 days supply | Qty: 30 | Fill #2

## 2020-01-17 MED FILL — VENLAFAXINE HCL ER 75 MG CA: 75 | 30 days supply | Qty: 30 | Fill #2

## 2020-01-18 ENCOUNTER — Other Ambulatory Visit: Payer: Self-pay

## 2020-01-18 ENCOUNTER — Ambulatory Visit (INDEPENDENT_AMBULATORY_CARE_PROVIDER_SITE_OTHER): Payer: No Typology Code available for payment source | Admitting: Family Medicine

## 2020-01-18 ENCOUNTER — Encounter: Payer: Self-pay | Admitting: Family Medicine

## 2020-01-18 VITALS — BP 112/76 | HR 107 | Temp 98.8°F | Wt 287.0 lb

## 2020-01-18 DIAGNOSIS — Z8742 Personal history of other diseases of the female genital tract: Secondary | ICD-10-CM | POA: Diagnosis not present

## 2020-01-18 DIAGNOSIS — R1031 Right lower quadrant pain: Secondary | ICD-10-CM

## 2020-01-18 LAB — POCT URINALYSIS DIPSTICK
Bilirubin, UA: NEGATIVE
Glucose, UA: NEGATIVE
Ketones, UA: NEGATIVE
Leukocytes, UA: NEGATIVE
Nitrite, UA: NEGATIVE
Protein, UA: POSITIVE — AB
Spec Grav, UA: 1.025 (ref 1.010–1.025)
Urobilinogen, UA: 0.2 E.U./dL
pH, UA: 6 (ref 5.0–8.0)

## 2020-01-18 LAB — CBC WITH DIFFERENTIAL/PLATELET
Absolute Monocytes: 602 cells/uL (ref 200–950)
Basophils Absolute: 43 cells/uL (ref 0–200)
Basophils Relative: 0.5 %
Eosinophils Absolute: 9 cells/uL — ABNORMAL LOW (ref 15–500)
Eosinophils Relative: 0.1 %
HCT: 39 % (ref 35.0–45.0)
Hemoglobin: 12.9 g/dL (ref 11.7–15.5)
Lymphs Abs: 2683 cells/uL (ref 850–3900)
MCH: 29.5 pg (ref 27.0–33.0)
MCHC: 33.1 g/dL (ref 32.0–36.0)
MCV: 89 fL (ref 80.0–100.0)
MPV: 9.6 fL (ref 7.5–12.5)
Monocytes Relative: 7 %
Neutro Abs: 5263 cells/uL (ref 1500–7800)
Neutrophils Relative %: 61.2 %
Platelets: 446 10*3/uL — ABNORMAL HIGH (ref 140–400)
RBC: 4.38 10*6/uL (ref 3.80–5.10)
RDW: 12.8 % (ref 11.0–15.0)
Total Lymphocyte: 31.2 %
WBC: 8.6 10*3/uL (ref 3.8–10.8)

## 2020-01-18 LAB — POCT URINE PREGNANCY: Preg Test, Ur: NEGATIVE

## 2020-01-18 MED ORDER — MELOXICAM 7.5 MG PO TABS: 7.5000 mg | ORAL_TABLET | Freq: Every day | ORAL | 0 refills | Status: DC

## 2020-01-18 MED FILL — MELOXICAM 7.5 MG TABLET: 7.5 | 30 days supply | Qty: 30 | Fill #0

## 2020-01-18 NOTE — Patient Instructions (Signed)
Abdominal Pain, Adult Pain in the abdomen (abdominal pain) can be caused by many things. Often, abdominal pain is not serious and it gets better with no treatment or by being treated at home. However, sometimes abdominal pain is serious. Your health care provider will ask questions about your medical history and do a physical exam to try to determine the cause of your abdominal pain. Follow these instructions at home:  Medicines  Take over-the-counter and prescription medicines only as told by your health care provider.  Do not take a laxative unless told by your health care provider. General instructions  Watch your condition for any changes.  Drink enough fluid to keep your urine pale yellow.  Keep all follow-up visits as told by your health care provider. This is important. Contact a health care provider if:  Your abdominal pain changes or gets worse.  You are not hungry or you lose weight without trying.  You are constipated or have diarrhea for more than 2-3 days.  You have pain when you urinate or have a bowel movement.  Your abdominal pain wakes you up at night.  Your pain gets worse with meals, after eating, or with certain foods.  You are vomiting and cannot keep anything down.  You have a fever.  You have blood in your urine. Get help right away if:  Your pain does not go away as soon as your health care provider told you to expect.  You cannot stop vomiting.  Your pain is only in areas of the abdomen, such as the right side or the left lower portion of the abdomen. Pain on the right side could be caused by appendicitis.  You have bloody or black stools, or stools that look like tar.  You have severe pain, cramping, or bloating in your abdomen.  You have signs of dehydration, such as: ? Dark urine, very little urine, or no urine. ? Cracked lips. ? Dry mouth. ? Sunken eyes. ? Sleepiness. ? Weakness.  You have trouble breathing or chest  pain. Summary  Often, abdominal pain is not serious and it gets better with no treatment or by being treated at home. However, sometimes abdominal pain is serious.  Watch your condition for any changes.  Take over-the-counter and prescription medicines only as told by your health care provider.  Contact a health care provider if your abdominal pain changes or gets worse.  Get help right away if you have severe pain, cramping, or bloating in your abdomen. This information is not intended to replace advice given to you by your health care provider. Make sure you discuss any questions you have with your health care provider. Document Revised: 09/19/2018 Document Reviewed: 09/19/2018 Elsevier Patient Education  [place].  Ovarian Cyst     An ovarian cyst is a fluid-filled sac that forms on an ovary. The ovaries are small organs that produce eggs in women. Various types of cysts can form on the ovaries. Some may cause symptoms and require treatment. Most ovarian cysts go away on their own, are not cancerous (are benign), and do not cause problems. Common types of ovarian cysts include:  Functional (follicle) cysts. ? Occur during the menstrual cycle, and usually go away with the next menstrual cycle if you do not get pregnant. ? Usually cause no symptoms.  Endometriomas. ? Are cysts that form from the tissue that lines the uterus (endometrium). ? Are sometimes called "chocolate cysts" because they become filled with blood that turns brown. ?  Can cause pain in the lower abdomen during intercourse and during your period.  Cystadenoma cysts. ? Develop from cells on the outside surface of the ovary. ? Can get very large and cause lower abdomen pain and pain with intercourse. ? Can cause severe pain if they twist or break open (rupture).  Dermoid cysts. ? Are sometimes found in both ovaries. ? May contain different kinds of body tissue, such as skin, teeth, hair, or  cartilage. ? Usually do not cause symptoms unless they get very big.  Theca lutein cysts. ? Occur when too much of a certain hormone (human chorionic gonadotropin) is produced and overstimulates the ovaries to produce an egg. ? Are most common after having procedures used to assist with the conception of a baby (in vitro fertilization). What are the causes? Ovarian cysts may be caused by:  Ovarian hyperstimulation syndrome. This is a condition that can develop from taking fertility medicines. It causes multiple large ovarian cysts to form.  Polycystic ovarian syndrome (PCOS). This is a common hormonal disorder that can cause ovarian cysts, as well as problems with your period or fertility. What increases the risk? The following factors may make you more likely to develop ovarian cysts:  Being overweight or obese.  Taking fertility medicines.  Taking certain forms of hormonal birth control.  Smoking. What are the signs or symptoms? Many ovarian cysts do not cause symptoms. If symptoms are present, they may include:  Pelvic pain or pressure.  Pain in the lower abdomen.  Pain during sex.  Abdominal swelling.  Abnormal menstrual periods.  Increasing pain with menstrual periods. How is this diagnosed? These cysts are commonly found during a routine pelvic exam. You may have tests to find out more about the cyst, such as:  Ultrasound.  X-ray of the pelvis.  CT scan.  MRI.  Blood tests. How is this treated? Many ovarian cysts go away on their own without treatment. Your health care provider may want to check your cyst regularly for 2-3 months to see if it changes. If you are in menopause, it is especially important to have your cyst monitored closely because menopausal women have a higher rate of ovarian cancer. When treatment is needed, it may include:  Medicines to help relieve pain.  A procedure to drain the cyst (aspiration).  Surgery to remove the whole  cyst.  Hormone treatment or birth control pills. These methods are sometimes used to help dissolve a cyst. Follow these instructions at home:  Take over-the-counter and prescription medicines only as told by your health care provider.  Do not drive or use heavy machinery while taking prescription pain medicine.  Get regular pelvic exams and Pap tests as often as told by your health care provider.  Return to your normal activities as told by your health care provider. Ask your health care provider what activities are safe for you.  Do not use any products that contain nicotine or tobacco, such as cigarettes and e-cigarettes. If you need help quitting, ask your health care provider.  Keep all follow-up visits as told by your health care provider. This is important. Contact a health care provider if:  Your periods are late, irregular, or painful, or they stop.  You have pelvic pain that does not go away.  You have pressure on your bladder or trouble emptying your bladder completely.  You have pain during sex.  You have any of the following in your abdomen: ? A feeling of fullness. ? Pressure. ?  Discomfort. ? Pain that does not go away. ? Swelling.  You feel generally ill.  You become constipated.  You lose your appetite.  You develop severe acne.  You start to have more body hair and facial hair.  You are gaining weight or losing weight without changing your exercise and eating habits.  You think you may be pregnant. Get help right away if:  You have abdominal pain that is severe or gets worse.  You cannot eat or drink without vomiting.  You suddenly develop a fever.  Your menstrual period is much heavier than usual. This information is not intended to replace advice given to you by your health care provider. Make sure you discuss any questions you have with your health care provider. Document Revised: 08/09/2017 Document Reviewed: 10/13/2015 Elsevier Patient  Education  2020 [employer].

## 2020-01-18 NOTE — Progress Notes (Signed)
Subjective:    Patient ID: Kristi Ewing, female    DOB: Jan 31, 1996, 24 y.o.   MRN: [id number]  No chief complaint on file.   HPI Pt is a 24 yo female with pmh sig for PCOS, anxiety, obesity, IUD in place followed by Dr. Sarajane Jews, who was seen today for acute concern.  Pt endorses RLQ pain x 8 days.  First [place].  Pain is a 7-8/10, constant.  Tylenol dulls the pain.  Feels similar to previous ovarian cyst.  Pt has brown vaginal d/c.  Pt denies n/v, fever, chills, constipation, dysuria.  Pt had kidney stones in the past, but this feels different.  Pt has IUD in place since March 2020, does not have a period just spotting.  Drinking 4 twenty ounce bottles of water per day.  Endorses having protected sex with the same partner.  Past Medical History:  Diagnosis Date  . Asthma    childhood  . Kidney stone   . PCOS (polycystic ovarian syndrome)    sees Dr. Louretta Shorten     Allergies  Allergen Reactions  . Hydrocodone-Homatropine     Rash/hives     ROS General: Denies fever, chills, night sweats, changes in weight, changes in appetite HEENT: Denies headaches, ear pain, changes in vision, rhinorrhea, sore throat CV: Denies CP, palpitations, SOB, orthopnea Pulm: Denies SOB, cough, wheezing GI: Denies abdominal pain, nausea, vomiting, diarrhea, constipation  +RLQ pain GU: Denies dysuria, hematuria, frequency  + vaginal discharge, H/o PCOS Msk: Denies muscle cramps, joint pains Neuro: Denies weakness, numbness, tingling Skin: Denies rashes, bruising Psych: Denies depression, anxiety, hallucinations      Objective:    Blood pressure 112/76, pulse (!) 107, temperature 98.8 F (37.1 C), temperature source Oral, weight 287 lb (130.2 kg), SpO2 98 %.   Gen. Pleasant, well-nourished, in no distress, normal affect, nontoxic HEENT: [place]/AT, face symmetric, conjunctiva clear, no scleral icterus, PERRLA, EOMI, nares patent without drainage Lungs: no accessory  muscle use, CTAB, no wheezes or rales Cardiovascular: RRR, no m/r/g, no peripheral edema Abdomen: BS present, soft, TTP in medial RLQ, ND, no hepatosplenomegaly. Musculoskeletal: No deformities, no cyanosis or clubbing, normal tone Neuro:  A&Ox3, CN II-XII intact, normal gait Skin:  Warm, no lesions/ rash   Wt Readings from Last 3 Encounters:  01/18/20 287 lb (130.2 kg)  08/16/19 275 lb (124.7 kg)  08/04/19 273 lb 6.4 oz (124 kg)    Lab Results  Component Value Date   WBC 7.1 01/19/2018   HGB 12.1 01/19/2018   HCT 37.8 01/19/2018   PLT 365 01/19/2018   GLUCOSE 86 01/19/2018   ALT 62 (H) 01/19/2018   AST 50 (H) 01/19/2018   NA 141 01/19/2018   K 4.2 01/19/2018   CL 107 01/19/2018   CREATININE 0.71 01/19/2018   BUN 9 01/19/2018   CO2 25 01/19/2018   TSH 1.42 10/15/2009    Assessment/Plan:  RLQ abdominal pain  -Discussed possible causes including ovarian cyst, appendicitis, ectopic pregnancy, IUD migration. -Discussed supportive care including NSAIDs, heat -We will obtain labs -Discussed obtaining imaging. -given rx for mobic -Patient given precautions for worsening symptoms - Plan: POCT urinalysis dipstick, US Transvaginal Non-OB, US Abdomen Limited, CBC with Differential/Platelet, meloxicam (MOBIC) 7.5 MG tablet, POCT urine pregnancy, C. trachomatis/N. gonorrhoeae RNA, CMP with eGFR([facility]), CBC with Differential/Platelet, C. trachomatis/N. gonorrhoeae RNA  History of PCOS -Continue follow-up with OB/GYN - Plan: US Transvaginal Non-OB, US Abdomen Limited  F/u prn  Makhai Fulco, MD 

## 2020-01-19 ENCOUNTER — Telehealth: Payer: Self-pay | Admitting: Family Medicine

## 2020-01-19 LAB — C. TRACHOMATIS/N. GONORRHOEAE RNA
C. trachomatis RNA, TMA: NOT DETECTED
N. gonorrhoeae RNA, TMA: NOT DETECTED

## 2020-01-19 NOTE — Telephone Encounter (Signed)
Order changed.

## 2020-01-19 NOTE — Telephone Encounter (Signed)
Spoke with Kristi Ewing with [facility] state that they don't need the abdominal US requests order to be changed to [place] for Pelvis and Transvaginal ([id number]) also need diagnoses depending on what you are trying to find from the imaging.

## 2020-01-19 NOTE — Addendum Note (Signed)
Addended by: Billie Ruddy on: 01/19/2020 05:35 PM   Modules accepted: Orders

## 2020-01-19 NOTE — Telephone Encounter (Signed)
Please advise 

## 2020-01-19 NOTE — Telephone Encounter (Signed)
[facility] is calling about  orders for the patient - Kristi Ewing said there are two different orders for this patient.  ( Dr. Volanda Napoleon ordered) She has questions before scheduling; please contact Amasa at  [phone]. May also need to change the orders, please call need to speak with someone to get clarification

## 2020-01-23 NOTE — Telephone Encounter (Signed)
Noted  

## 2020-02-01 MED FILL — CLONAZEPAM 1 MG TABS: 1 | 30 days supply | Qty: 30 | Fill #1

## 2020-02-19 MED FILL — SUBVENITE 200 MG TABS: 200 | 30 days supply | Qty: 30 | Fill #3

## 2020-02-28 MED FILL — VENLAFAXINE HCL ER 75 MG CA: 75 | 30 days supply | Qty: 30 | Fill #3

## 2020-03-11 MED FILL — SUBVENITE 200 MG TABS: 200 | 30 days supply | Qty: 30 | Fill #3

## 2020-03-11 MED FILL — CLONAZEPAM 1 MG TABS: 1 | 30 days supply | Qty: 30 | Fill #2

## 2020-03-19 MED FILL — QUETIAPINE FUMARATE 300 MG: 300 | 90 days supply | Qty: 90 | Fill #1

## 2020-03-27 MED FILL — VENLAFAXINE HCL ER 75 MG CA: 75 | 30 days supply | Qty: 30 | Fill #4

## 2020-03-28 ENCOUNTER — Other Ambulatory Visit (HOSPITAL_COMMUNITY): Payer: Self-pay | Admitting: Psychiatry

## 2020-03-28 MED FILL — CLONAZEPAM 1 MG TABS: 1 | 30 days supply | Qty: 60 | Fill #0

## 2020-04-10 MED FILL — SUBVENITE 200 MG TABS: 200 | 30 days supply | Qty: 30 | Fill #4

## 2020-04-29 MED FILL — CLONAZEPAM 1 MG TABS: 1 | 30 days supply | Qty: 60 | Fill #1

## 2020-04-29 MED FILL — VENLAFAXINE HCL ER 75 MG CA: 75 | 30 days supply | Qty: 30 | Fill #5

## 2020-05-11 MED FILL — SUBVENITE 200 MG TABS: 200 | 30 days supply | Qty: 30 | Fill #5

## 2020-05-29 MED FILL — VENLAFAXINE HCL ER 75 MG CA: 75 | 90 days supply | Qty: 90 | Fill #0

## 2020-06-05 ENCOUNTER — Encounter: Payer: Self-pay | Admitting: Family Medicine

## 2020-06-05 ENCOUNTER — Telehealth (INDEPENDENT_AMBULATORY_CARE_PROVIDER_SITE_OTHER): Payer: No Typology Code available for payment source | Admitting: Family Medicine

## 2020-06-05 ENCOUNTER — Other Ambulatory Visit: Payer: Self-pay | Admitting: Family Medicine

## 2020-06-05 VITALS — Temp 98.6°F

## 2020-06-05 DIAGNOSIS — B349 Viral infection, unspecified: Secondary | ICD-10-CM | POA: Diagnosis not present

## 2020-06-05 MED ORDER — ONDANSETRON HCL 8 MG PO TABS: 8.0000 mg | ORAL_TABLET | Freq: Four times a day (QID) | ORAL | 0 refills | Status: DC | PRN

## 2020-06-05 MED FILL — ONDANSETRON HCL 8 MG TABLET: 8 | 15 days supply | Qty: 60 | Fill #0

## 2020-06-05 NOTE — Progress Notes (Signed)
Subjective:    Patient ID: Kristi Ewing, female    DOB: 1996-02-06, 25 y.o.   MRN: [id number]  HPI Virtual Visit via Video Note  I connected with the patient on 06/05/20 at  1:45 PM EST by a video enabled telemedicine application and verified that I am speaking with the correct person using two identifiers.  Location patient: home Location provider:work or home office Persons participating in the virtual visit: patient, provider  I discussed the limitations of evaluation and management by telemedicine and the availability of in person appointments. The patient expressed understanding and agreed to proceed.   HPI: Here for 3 days of stuffy head, PND, ST, dry cough, and nausea with vomiting. No fever or SOB. No body aches or diarrhea. Drinking fluids. She tested negative for the Covid virus this morning with a rapid at home test.  ROS: See pertinent positives and negatives per HPI.  Past Medical History:  Diagnosis Date  . Asthma    childhood  . Kidney stone   . PCOS (polycystic ovarian syndrome)    sees Dr. Louretta Shorten     Past Surgical History:  Procedure Laterality Date  . LAPAROSCOPIC CHOLECYSTECTOMY SINGLE PORT  01/19/2018   Procedure: LAPAROSCOPIC CHOLECYSTECTOMY SINGLE SITE WITH INTRAOPERATIVE CHOLANGIOGRAM;  Surgeon: Michael Boston, MD;  Location: [facility];  Service: General;;    Family History  Problem Relation Age of Onset  . Diabetes Other   . Anxiety disorder Other   . Hypertension Mother   . Anxiety disorder Mother   . Diabetes Father   . Hypertension Father   . Anxiety disorder Brother   . Schizophrenia Maternal Uncle   . Anxiety disorder Maternal Grandmother      Current Outpatient Medications:  .  clonazePAM (KLONOPIN) 1 MG tablet, Take 1 mg by mouth 2 (two) times daily., Disp: , Rfl:  .  lamoTRIgine (LAMICTAL) 100 MG tablet, Take 100 mg by mouth daily., Disp: , Rfl:  .  meloxicam (MOBIC) 7.5 MG tablet, Take 1 tablet (7.5 mg total) by mouth  daily., Disp: 30 tablet, Rfl: 0 .  ondansetron (ZOFRAN) 8 MG tablet, Take 1 tablet (8 mg total) by mouth every 6 (six) hours as needed for nausea or vomiting., Disp: 60 tablet, Rfl: 0 .  QUEtiapine (SEROQUEL) 200 MG tablet, Take 200 mg by mouth at bedtime., Disp: , Rfl:  .  venlafaxine XR (EFFEXOR-XR) 150 MG 24 hr capsule, TAKE 1 CAPSULE ONCE DAILY WITH BREAKFAST., Disp: 90 capsule, Rfl: 3  EXAM:  VITALS per patient if applicable:  GENERAL: alert, oriented, appears well and in no acute distress  HEENT: atraumatic, conjunttiva clear, no obvious abnormalities on inspection of external nose and ears  NECK: normal movements of the head and neck  LUNGS: on inspection no signs of respiratory distress, breathing rate appears normal, no obvious gross SOB, gasping or wheezing  CV: no obvious cyanosis  MS: moves all visible extremities without noticeable abnormality  PSYCH/NEURO: pleasant and cooperative, no obvious depression or anxiety, speech and thought processing grossly intact  ASSESSMENT AND PLAN: Viral illness. Drink fluids and take Zofran as needed. Recheck prn.  Alysia Penna, MD  Discussed the following assessment and plan:  No diagnosis found.     I discussed the assessment and treatment plan with the patient. The patient was provided an opportunity to ask questions and all were answered. The patient agreed with the plan and demonstrated an understanding of the instructions.   The patient was advised to call  back or seek an in-person evaluation if the symptoms worsen or if the condition fails to improve as anticipated.     Review of Systems     Objective:   Physical Exam        Assessment & Plan:

## 2020-06-11 ENCOUNTER — Encounter: Payer: Self-pay | Admitting: Family Medicine

## 2020-06-11 MED FILL — CLONAZEPAM 1 MG TABS: 1 | 30 days supply | Qty: 60 | Fill #2

## 2020-06-12 NOTE — Telephone Encounter (Signed)
We can certainly get her a note, but first ask her exactly what days did she miss work and when did she go back to work ?

## 2020-06-13 ENCOUNTER — Other Ambulatory Visit (HOSPITAL_COMMUNITY): Payer: Self-pay | Admitting: Psychiatry

## 2020-06-13 MED FILL — SUBVENITE 200 MG TABS: 200 | 30 days supply | Qty: 30 | Fill #0

## 2020-06-19 MED FILL — QUETIAPINE FUMARATE 300 MG: 300 | 90 days supply | Qty: 90 | Fill #2

## 2020-08-13 MED FILL — SUBVENITE 200 MG TABS: 200 | 30 days supply | Qty: 30 | Fill #2

## 2020-08-29 ENCOUNTER — Other Ambulatory Visit (HOSPITAL_COMMUNITY): Payer: Self-pay | Admitting: Psychiatry

## 2020-08-29 ENCOUNTER — Other Ambulatory Visit (HOSPITAL_COMMUNITY): Payer: Self-pay

## 2020-08-29 MED ORDER — CLONAZEPAM 1 MG PO TABS
1.0000 mg | ORAL_TABLET | Freq: Two times a day (BID) | ORAL | 2 refills | Status: DC
  Filled 2020-08-29: qty 60, 30d supply, fill #0
  Filled 2020-10-25: qty 60, 30d supply, fill #1
  Filled 2020-12-01: qty 60, 30d supply, fill #2

## 2020-09-02 ENCOUNTER — Other Ambulatory Visit (HOSPITAL_COMMUNITY): Payer: Self-pay

## 2020-09-02 MED FILL — Venlafaxine HCl Cap ER 24HR 75 MG (Base Equivalent): ORAL | 90 days supply | Qty: 90 | Fill #0 | Status: AC

## 2020-09-05 ENCOUNTER — Other Ambulatory Visit (HOSPITAL_COMMUNITY): Payer: Self-pay

## 2020-09-15 MED FILL — Quetiapine Fumarate Tab 300 MG: ORAL | 90 days supply | Qty: 90 | Fill #0 | Status: AC

## 2020-09-15 MED FILL — Lamotrigine Tab 200 MG: ORAL | 30 days supply | Qty: 30 | Fill #0 | Status: AC

## 2020-09-16 ENCOUNTER — Other Ambulatory Visit (HOSPITAL_COMMUNITY): Payer: Self-pay

## 2020-10-14 MED FILL — Lamotrigine Tab 200 MG: ORAL | 30 days supply | Qty: 30 | Fill #1 | Status: AC

## 2020-10-15 ENCOUNTER — Other Ambulatory Visit (HOSPITAL_COMMUNITY): Payer: Self-pay

## 2020-10-22 ENCOUNTER — Other Ambulatory Visit (HOSPITAL_COMMUNITY): Payer: Self-pay

## 2020-10-22 MED ORDER — GABAPENTIN 300 MG PO CAPS
300.0000 mg | ORAL_CAPSULE | Freq: Every evening | ORAL | 2 refills | Status: DC
  Filled 2020-10-22: qty 30, 30d supply, fill #0
  Filled 2020-11-20: qty 30, 30d supply, fill #1
  Filled 2020-12-19: qty 30, 30d supply, fill #2

## 2020-10-25 ENCOUNTER — Other Ambulatory Visit (HOSPITAL_COMMUNITY): Payer: Self-pay

## 2020-11-18 ENCOUNTER — Other Ambulatory Visit (HOSPITAL_COMMUNITY): Payer: Self-pay

## 2020-11-18 MED FILL — Lamotrigine Tab 200 MG: ORAL | 30 days supply | Qty: 30 | Fill #2 | Status: AC

## 2020-11-21 ENCOUNTER — Other Ambulatory Visit (HOSPITAL_COMMUNITY): Payer: Self-pay

## 2020-12-01 MED FILL — Venlafaxine HCl Cap ER 24HR 75 MG (Base Equivalent): ORAL | 90 days supply | Qty: 90 | Fill #1 | Status: AC

## 2020-12-02 ENCOUNTER — Other Ambulatory Visit (HOSPITAL_COMMUNITY): Payer: Self-pay

## 2020-12-13 ENCOUNTER — Other Ambulatory Visit (HOSPITAL_COMMUNITY): Payer: Self-pay

## 2020-12-13 MED ORDER — QUETIAPINE FUMARATE 300 MG PO TABS
ORAL_TABLET | Freq: Every day | ORAL | 99 refills | Status: AC
  Filled 2020-12-13: qty 90, 90d supply, fill #0
  Filled 2021-03-16: qty 90, 90d supply, fill #1
  Filled 2021-06-15 – 2021-06-16 (×2): qty 90, 90d supply, fill #2
  Filled 2021-09-14: qty 90, 90d supply, fill #3

## 2020-12-19 MED FILL — Lamotrigine Tab 200 MG: ORAL | 30 days supply | Qty: 30 | Fill #3 | Status: AC

## 2020-12-20 ENCOUNTER — Other Ambulatory Visit (HOSPITAL_COMMUNITY): Payer: Self-pay

## 2021-01-20 ENCOUNTER — Other Ambulatory Visit (HOSPITAL_COMMUNITY): Payer: Self-pay

## 2021-01-20 MED ORDER — CLONAZEPAM 1 MG PO TABS
ORAL_TABLET | ORAL | 3 refills | Status: DC
  Filled 2021-01-20: qty 60, 30d supply, fill #0
  Filled 2021-02-18: qty 60, 30d supply, fill #1
  Filled 2021-04-20: qty 60, 30d supply, fill #2
  Filled 2021-06-02: qty 60, 30d supply, fill #3

## 2021-01-20 MED ORDER — GABAPENTIN 300 MG PO CAPS
300.0000 mg | ORAL_CAPSULE | Freq: Every evening | ORAL | 4 refills | Status: DC
  Filled 2021-01-20: qty 30, 30d supply, fill #0
  Filled 2021-02-18: qty 30, 30d supply, fill #1
  Filled 2021-03-18: qty 30, 30d supply, fill #2
  Filled 2021-04-20: qty 30, 30d supply, fill #3
  Filled 2021-05-20: qty 30, 30d supply, fill #4

## 2021-01-20 MED FILL — Lamotrigine Tab 200 MG: ORAL | 30 days supply | Qty: 30 | Fill #4 | Status: AC

## 2021-01-22 ENCOUNTER — Other Ambulatory Visit (HOSPITAL_COMMUNITY): Payer: Self-pay

## 2021-02-18 MED FILL — Lamotrigine Tab 200 MG: ORAL | 30 days supply | Qty: 30 | Fill #5 | Status: AC

## 2021-02-19 ENCOUNTER — Other Ambulatory Visit (HOSPITAL_COMMUNITY): Payer: Self-pay

## 2021-03-05 ENCOUNTER — Other Ambulatory Visit (HOSPITAL_COMMUNITY): Payer: Self-pay

## 2021-03-05 MED FILL — Lamotrigine Tab 200 MG: ORAL | 30 days supply | Qty: 30 | Fill #6 | Status: AC

## 2021-03-06 ENCOUNTER — Other Ambulatory Visit (HOSPITAL_COMMUNITY): Payer: Self-pay

## 2021-03-06 MED ORDER — VENLAFAXINE HCL ER 75 MG PO CP24
ORAL_CAPSULE | ORAL | 4 refills | Status: AC
  Filled 2021-03-06: qty 90, 90d supply, fill #0
  Filled 2021-06-02: qty 90, 90d supply, fill #1
  Filled 2021-09-02: qty 90, 90d supply, fill #2

## 2021-03-13 ENCOUNTER — Other Ambulatory Visit (HOSPITAL_COMMUNITY): Payer: Self-pay

## 2021-03-14 ENCOUNTER — Other Ambulatory Visit (HOSPITAL_COMMUNITY): Payer: Self-pay

## 2021-03-17 ENCOUNTER — Other Ambulatory Visit (HOSPITAL_COMMUNITY): Payer: Self-pay

## 2021-03-18 ENCOUNTER — Other Ambulatory Visit (HOSPITAL_COMMUNITY): Payer: Self-pay

## 2021-03-25 ENCOUNTER — Other Ambulatory Visit: Payer: Self-pay

## 2021-03-25 ENCOUNTER — Ambulatory Visit (INDEPENDENT_AMBULATORY_CARE_PROVIDER_SITE_OTHER): Payer: No Typology Code available for payment source | Admitting: Family Medicine

## 2021-03-25 VITALS — BP 130/72 | HR 74 | Temp 98.3°F | Wt 305.8 lb

## 2021-03-25 DIAGNOSIS — L02411 Cutaneous abscess of right axilla: Secondary | ICD-10-CM | POA: Diagnosis not present

## 2021-03-25 NOTE — Patient Instructions (Signed)
Return in 3 days for packing removal and to reassess  Try to keep dressing as dry as possible.

## 2021-03-25 NOTE — Progress Notes (Signed)
Established Patient Office Visit  Subjective:  Patient ID: Kristi Ewing, female    DOB: 1996/01/30  Age: 25 y.o. MRN: [id number]  CC:  Chief Complaint  Patient presents with   Cyst    Under the right arm, notice on Wednesday, has become very painful, did have some drainage that was greenish in color    HPI Kristi Ewing presents for painful cystic lesion under her right axillary region which she first noted about last Wednesday.  Progressively painful since then.  Somewhat more fluctuant.  Sunday she thinks she had a little bit of small amount of drainage.  No fevers or chills.  No history of MRSA.  Past Medical History:  Diagnosis Date   Asthma    childhood   Kidney stone    PCOS (polycystic ovarian syndrome)    sees Dr. Louretta Shorten     Past Surgical History:  Procedure Laterality Date   LAPAROSCOPIC CHOLECYSTECTOMY SINGLE PORT  01/19/2018   Procedure: LAPAROSCOPIC CHOLECYSTECTOMY SINGLE SITE WITH INTRAOPERATIVE CHOLANGIOGRAM;  Surgeon: Michael Boston, MD;  Location: [facility];  Service: General;;    Family History  Problem Relation Age of Onset   Diabetes Other    Anxiety disorder Other    Hypertension Mother    Anxiety disorder Mother    Diabetes Father    Hypertension Father    Anxiety disorder Brother    Schizophrenia Maternal Uncle    Anxiety disorder Maternal Grandmother     Social History   Socioeconomic History   Marital status: Single    Spouse name: Not on file   Number of children: Not on file   Years of education: Not on file   Highest education level: Bachelor's degree (e.g., BA, AB, BS)  Occupational History   Not on file  Tobacco Use   Smoking status: Never   Smokeless tobacco: Never  Vaping Use   Vaping Use: Never used  Substance and Sexual Activity   Alcohol use: No    Alcohol/week: 0.0 standard drinks   Drug use: No   Sexual activity: Never    Birth control/protection: Abstinence  Other Topics Concern   Not on file   Social History Narrative   Not on file   Social Determinants of Health   Financial Resource Strain: Low Risk    Difficulty of Paying Living Expenses: Not hard at all  Food Insecurity: No Food Insecurity   Worried About Charity fundraiser in the Last Year: Never true   Ran Out of Food in the Last Year: Never true  Transportation Needs: No Transportation Needs   Lack of Transportation (Medical): No   Lack of Transportation (Non-Medical): No  Physical Activity: Sufficiently Active   Days of Exercise per Week: 3 days   Minutes of Exercise per Session: 60 min  Stress: Unknown   Feeling of Stress : Patient refused  Social Connections: Unknown   Frequency of Communication with Friends and Family: More than three times a week   Frequency of Social Gatherings with Friends and Family: Patient refused   Attends Religious Services: Patient refused   Marine scientist or Organizations: No   Attends Music therapist: Not on file   Marital Status: Never married  Intimate Partner Violence: Not on file    Outpatient Medications Prior to Visit  Medication Sig Dispense Refill   clonazePAM (KLONOPIN) 1 MG tablet Take 1 mg by mouth 2 (two) times daily.     clonazePAM (KLONOPIN) 1  MG tablet Take 1 tablet by mouth twice a day as needed for anxiety or panic. 60 tablet 3   gabapentin (NEURONTIN) 300 MG capsule Take 1 capsule (300 mg total) by mouth at bedtime. 30 capsule 4   lamoTRIgine (LAMICTAL) 100 MG tablet Take 100 mg by mouth daily.     lamoTRIgine (LAMICTAL) 200 MG tablet TAKE 1 TABLET BY MOUTH AT BEDTIME 30 tablet 99   QUEtiapine (SEROQUEL) 200 MG tablet Take 200 mg by mouth at bedtime.     QUEtiapine (SEROQUEL) 300 MG tablet TAKE 1 TABLET BY MOUTH ONCE DAILY 90 tablet PRN   venlafaxine XR (EFFEXOR-XR) 75 MG 24 hr capsule Take 1 capsule by mouth daily 90 capsule 4   clonazePAM (KLONOPIN) 1 MG tablet TAKE 1 TABLET BY MOUTH TWICE DAILY AS NEEDED FOR ANXIETY OR PANIC 60 tablet  3   clonazePAM (KLONOPIN) 1 MG tablet Take 1 tablet by mouth twice a day as needed for anxiety or panic 60 tablet 2   lamoTRIgine (LAMICTAL) 200 MG tablet TAKE 1 TABLET BY MOUTH AT BEDTIME 30 tablet 5   meloxicam (MOBIC) 7.5 MG tablet Take 1 tablet (7.5 mg total) by mouth daily. 30 tablet 0   ondansetron (ZOFRAN) 8 MG tablet TAKE 1 TABLET BY MOUTH EVERY 6 HOURS AS NEEDED FOR NAUSEA OR VOMITING. 60 tablet 0   venlafaxine XR (EFFEXOR-XR) 150 MG 24 hr capsule TAKE 1 CAPSULE ONCE DAILY WITH BREAKFAST. 90 capsule 3   venlafaxine XR (EFFEXOR-XR) 75 MG 24 hr capsule TAKE 1 CAPSULE BY MOUTH ONCE DAILY 90 capsule 99   venlafaxine XR (EFFEXOR-XR) 75 MG 24 hr capsule TAKE 1 CAPSULE BY MOUTH ONCE A DAY 30 capsule 5   No facility-administered medications prior to visit.    Allergies  Allergen Reactions   Hydrocodone Bit-Homatrop Mbr     Rash/hives     ROS Review of Systems  Constitutional:  Negative for chills and fever.     Objective:    Physical Exam Vitals reviewed.  Skin:    Comments: Right axillary region reveals area of erythema, swelling, tenderness, fluctuance.  Area of fluctuance is about 1-1/2 x 1 cm.  Neurological:     Mental Status: She is alert.    BP 130/72 (BP Location: Left Arm, Patient Position: Sitting, Cuff Size: Normal)   Pulse 74   Temp 98.3 F (36.8 C) (Oral)   Wt (!) 305 lb 12.8 oz (138.7 kg)   SpO2 98%   BMI (P) 47.90 kg/m  Wt Readings from Last 3 Encounters:  03/25/21 (!) 305 lb 12.8 oz (138.7 kg)  01/18/20 287 lb (130.2 kg)  08/16/19 275 lb (124.7 kg)     Health Maintenance Due  Topic Date Due   COVID-19 Vaccine (1) Never done   Hepatitis C Screening  Never done   PAP-Cervical Cytology Screening  Never done   TETANUS/TDAP  02/19/2019   PAP SMEAR-Modifier  05/26/2019   INFLUENZA VACCINE  12/23/2020    There are no preventive care reminders to display for this patient.  Lab Results  Component Value Date   TSH 1.42 10/15/2009   Lab Results   Component Value Date   WBC 8.6 01/18/2020   HGB 12.9 01/18/2020   HCT 39.0 01/18/2020   MCV 89.0 01/18/2020   PLT 446 (H) 01/18/2020   Lab Results  Component Value Date   NA 141 01/19/2018   K 4.2 01/19/2018   CO2 25 01/19/2018   GLUCOSE 86 01/19/2018   BUN  9 01/19/2018   CREATININE 0.71 01/19/2018   BILITOT 0.6 01/19/2018   ALKPHOS 63 01/19/2018   AST 50 (H) 01/19/2018   ALT 62 (H) 01/19/2018   PROT 6.7 01/19/2018   ALBUMIN 3.7 01/19/2018   CALCIUM 9.0 01/19/2018   ANIONGAP 9 01/19/2018   GFR 85.72 08/24/2014   No results found for: CHOL No results found for: HDL No results found for: LDLCALC No results found for: TRIG No results found for: CHOLHDL No results found for: HGBA1C    Assessment & Plan:   Abscess right axilla  Recommend incision and drainage.  We discussed the procedure risk and benefits including bleeding, pain and patient consented.   Local anesthesia with 1% plain xylocaine.  1 cm incision over area of fluctuance with #11 blade.   Copious pus expressed.  Wound explored with curved hemostats.   Minimal bleeding.  Packing with 1/4 inch iodoform gauze.  Pt tolerated well.  Outer dressing applied.  Keep dry  Return in 2-3 days for re-check and packing removal.  No orders of the defined types were placed in this encounter.   Follow-up: No follow-ups on file.    Carolann Littler, MD

## 2021-03-28 ENCOUNTER — Ambulatory Visit (INDEPENDENT_AMBULATORY_CARE_PROVIDER_SITE_OTHER): Payer: No Typology Code available for payment source | Admitting: Family Medicine

## 2021-03-28 ENCOUNTER — Other Ambulatory Visit: Payer: Self-pay

## 2021-03-28 VITALS — Temp 98.4°F

## 2021-03-28 DIAGNOSIS — L02411 Cutaneous abscess of right axilla: Secondary | ICD-10-CM

## 2021-03-28 NOTE — Progress Notes (Signed)
Here for packing removal right axillary abscess.  Overall improved.  Less swelling.  Less tender.  She did try some type of tape at the hospital and had allergic reaction had some redness from that but the abscess itself looks better.  Packing removed.  She does still have some purulent drainage and we decided to go ahead and repacked the abscess cavity and she tolerated well.  Outer dressing applied.  We have instructed her to pull packing in 2 days and then clean daily with soap and water. Follow-up as needed for any recurrent pain or swelling.  Eulas Post MD [facility]

## 2021-04-20 ENCOUNTER — Other Ambulatory Visit (HOSPITAL_COMMUNITY): Payer: Self-pay

## 2021-04-20 MED FILL — Lamotrigine Tab 200 MG: ORAL | 30 days supply | Qty: 30 | Fill #7 | Status: AC

## 2021-04-21 ENCOUNTER — Other Ambulatory Visit (HOSPITAL_COMMUNITY): Payer: Self-pay

## 2021-05-20 MED FILL — Lamotrigine Tab 200 MG: ORAL | 30 days supply | Qty: 30 | Fill #8 | Status: AC

## 2021-05-21 ENCOUNTER — Other Ambulatory Visit (HOSPITAL_COMMUNITY): Payer: Self-pay

## 2021-06-03 ENCOUNTER — Other Ambulatory Visit (HOSPITAL_COMMUNITY): Payer: Self-pay

## 2021-06-16 ENCOUNTER — Other Ambulatory Visit (HOSPITAL_COMMUNITY): Payer: Self-pay

## 2021-06-17 ENCOUNTER — Other Ambulatory Visit (HOSPITAL_COMMUNITY): Payer: Self-pay

## 2021-06-17 MED ORDER — LAMOTRIGINE 200 MG PO TABS
ORAL_TABLET | ORAL | 11 refills | Status: DC
  Filled 2021-06-17: qty 30, 30d supply, fill #0
  Filled 2021-07-21: qty 30, 30d supply, fill #1
  Filled 2021-08-19: qty 30, 30d supply, fill #2
  Filled 2021-09-14: qty 30, 30d supply, fill #3

## 2021-06-17 MED ORDER — GABAPENTIN 300 MG PO CAPS
300.0000 mg | ORAL_CAPSULE | Freq: Every evening | ORAL | 4 refills | Status: AC
  Filled 2021-06-17: qty 30, 30d supply, fill #0
  Filled 2021-07-21: qty 30, 30d supply, fill #1
  Filled 2021-08-19: qty 30, 30d supply, fill #2
  Filled 2021-09-14: qty 30, 30d supply, fill #3

## 2021-06-19 ENCOUNTER — Other Ambulatory Visit (HOSPITAL_COMMUNITY): Payer: Self-pay

## 2021-06-23 ENCOUNTER — Telehealth: Payer: Self-pay

## 2021-06-23 NOTE — Telephone Encounter (Signed)
Make her an OV with [place] ASAP

## 2021-06-23 NOTE — Telephone Encounter (Signed)
Caller states her daughter has blood in her vomit. Patient reports that she has chronic n/v due to anxiety. She vomited several times this morning and she noted more red tinged vomitus than brown tinged. She is having mid-lower stomach cramps for the last 4-5 days. Denies diarrhea/fever.  06/23/2021 11:04:05 AM Go to ED Now Emilee Hero, RN, Rhonda  Referrals [facility] - ED  06/23/21 1215: LVM for pt to call office to update status.  Pt states she has talked to the NP this morning. Has felt lightheaded since Saturday; state she "passed out" but did not lose consciousness. Pt states this am she began vomiting this am; has vomited x 10 since then. Noticed blood in vomitus after the 1st few. Denies fever. Pt states she is at work & is unable to go to the ED. Has not tried to take anything & has been able to keep water down.  States NP that she talked to this am stated she may have hypovolemia & recommended that she go to ED. Pt states she is unable to do this b/c she is at work. Will send triage to PCP.

## 2021-06-23 NOTE — Telephone Encounter (Signed)
Appointment had been scheduled for patient on 06/24/2021 at 10:30am

## 2021-06-24 ENCOUNTER — Encounter: Payer: Self-pay | Admitting: Family Medicine

## 2021-06-24 ENCOUNTER — Ambulatory Visit (INDEPENDENT_AMBULATORY_CARE_PROVIDER_SITE_OTHER): Payer: No Typology Code available for payment source | Admitting: Family Medicine

## 2021-06-24 ENCOUNTER — Other Ambulatory Visit (HOSPITAL_COMMUNITY): Payer: Self-pay

## 2021-06-24 VITALS — BP 110/76 | HR 98 | Temp 98.5°F | Wt 304.0 lb

## 2021-06-24 DIAGNOSIS — R55 Syncope and collapse: Secondary | ICD-10-CM | POA: Diagnosis not present

## 2021-06-24 DIAGNOSIS — K920 Hematemesis: Secondary | ICD-10-CM

## 2021-06-24 DIAGNOSIS — K219 Gastro-esophageal reflux disease without esophagitis: Secondary | ICD-10-CM | POA: Diagnosis not present

## 2021-06-24 LAB — CBC WITH DIFFERENTIAL/PLATELET
Basophils Absolute: 0 10*3/uL (ref 0.0–0.1)
Basophils Relative: 0.4 % (ref 0.0–3.0)
Eosinophils Absolute: 0.1 10*3/uL (ref 0.0–0.7)
Eosinophils Relative: 1.3 % (ref 0.0–5.0)
HCT: 42.7 % (ref 36.0–46.0)
Hemoglobin: 14 g/dL (ref 12.0–15.0)
Lymphocytes Relative: 34.3 % (ref 12.0–46.0)
Lymphs Abs: 2.8 10*3/uL (ref 0.7–4.0)
MCHC: 32.7 g/dL (ref 30.0–36.0)
MCV: 91.2 fl (ref 78.0–100.0)
Monocytes Absolute: 0.6 10*3/uL (ref 0.1–1.0)
Monocytes Relative: 7.2 % (ref 3.0–12.0)
Neutro Abs: 4.7 10*3/uL (ref 1.4–7.7)
Neutrophils Relative %: 56.8 % (ref 43.0–77.0)
Platelets: 407 10*3/uL — ABNORMAL HIGH (ref 150.0–400.0)
RBC: 4.69 Mil/uL (ref 3.87–5.11)
RDW: 14.1 % (ref 11.5–15.5)
WBC: 8.3 10*3/uL (ref 4.0–10.5)

## 2021-06-24 LAB — BASIC METABOLIC PANEL
BUN: 9 mg/dL (ref 6–23)
CO2: 26 mEq/L (ref 19–32)
Calcium: 9.5 mg/dL (ref 8.4–10.5)
Chloride: 105 mEq/L (ref 96–112)
Creatinine, Ser: 0.84 mg/dL (ref 0.40–1.20)
GFR: 95.46 mL/min (ref >60.00)
Glucose, Bld: 118 mg/dL — ABNORMAL HIGH (ref 70–99)
Potassium: 3.7 mEq/L (ref 3.5–5.1)
Sodium: 138 mEq/L (ref 135–145)

## 2021-06-24 LAB — HEPATIC FUNCTION PANEL
ALT: 25 U/L (ref 0–35)
AST: 17 U/L (ref 0–37)
Albumin: 4.5 g/dL (ref 3.5–5.2)
Alkaline Phosphatase: 62 U/L (ref 39–117)
Bilirubin, Direct: 0.1 mg/dL (ref 0.0–0.3)
Total Bilirubin: 0.4 mg/dL (ref 0.2–1.2)
Total Protein: 7.3 g/dL (ref 6.0–8.3)

## 2021-06-24 LAB — TSH: TSH: 0.92 u[IU]/mL (ref 0.35–5.50)

## 2021-06-24 MED ORDER — OMEPRAZOLE 40 MG PO CPDR
40.0000 mg | DELAYED_RELEASE_CAPSULE | Freq: Every morning | ORAL | 3 refills | Status: DC
  Filled 2021-06-24: qty 90, 90d supply, fill #0

## 2021-06-24 NOTE — Progress Notes (Signed)
° °  Subjective:    Patient ID: Kristi Ewing, female    DOB: [birth year], 25 y.o.   MRN: [id number]  HPI Here for several issues. First she says she often feels lightheaded when she gets out of bed or when she stands up from a sitting position. Then 2 days ago she apparently passed out in her laundry room. She thinks she was out only for a few seconds and then she got back up and felt fine. No trauma during the fall. Then 2 nights ago she began vomiting. She says she has dealt with nausea on a daily basis for several years and she usually vomits once a day. The evening before last she vomited 5 times, then yesterday she vomited about 12 times throughout the day. So far today she feels better but she has vomited twice. Yesterday she began to small streaks of bright red blood in the vomitus, but the blood is gone today. She never sees dark material or coffee grounds in the vomitus. She has occasional heartburn and she takes TUMS for this. She has not had menses since she had an IUD installed several years ago. She sees Dr. Toni Arthurs for psychiatric care, and she has been on the same medication regimen from him for years. She takes all her medications together at bedtime. She denies any abdominal pain or fever lately, and her BM's have been normal.     Review of Systems  Constitutional: Negative.   HENT: Negative.    Respiratory: Negative.    Cardiovascular: Negative.   Gastrointestinal:  Positive for nausea and vomiting. Negative for abdominal distention, abdominal pain, anal bleeding, blood in stool, constipation and diarrhea.  Genitourinary: Negative.   Neurological:  Positive for syncope and light-headedness. Negative for dizziness.      Objective:   Physical Exam Constitutional:      Appearance: Normal appearance. She is not ill-appearing.  Cardiovascular:     Rate and Rhythm: Normal rate and regular rhythm.     Pulses: Normal pulses.     Heart sounds: Normal heart sounds.  Pulmonary:      Effort: Pulmonary effort is normal.     Breath sounds: Normal breath sounds.  Abdominal:     General: Abdomen is flat. Bowel sounds are normal. There is no distension.     Palpations: Abdomen is soft. There is no mass.     Tenderness: There is no guarding or rebound.     Hernia: No hernia is present.     Comments: Slightly tender in the LUQ   Neurological:     Mental Status: She is alert.          Assessment & Plan:  It seems several issues have been at play. First she seems to have some orthostasis when standing up quickly, and this is likely due to side effects of her psychiatric medications. These medications are also likely to be causing her chronic nausea as a side effect. She may also have some gastritis or GERD which is a contributing factor. We will check labs today to include a CBC. I advised her to drink plenty of fluids every day. She will begin taking Omeprazole every morning. I advised her to consult with her psychiatrist about possible side effects of her medications. We spent a total of ( 33  ) minutes reviewing records and discussing these issues.  Gershon Crane, MD

## 2021-07-21 ENCOUNTER — Other Ambulatory Visit (HOSPITAL_COMMUNITY): Payer: Self-pay

## 2021-07-22 ENCOUNTER — Other Ambulatory Visit (HOSPITAL_COMMUNITY): Payer: Self-pay

## 2021-07-22 MED ORDER — CLONAZEPAM 1 MG PO TABS
ORAL_TABLET | ORAL | 1 refills | Status: DC
  Filled 2021-07-22: qty 60, 30d supply, fill #0
  Filled 2021-08-19: qty 60, 30d supply, fill #1

## 2021-08-19 ENCOUNTER — Other Ambulatory Visit (HOSPITAL_COMMUNITY): Payer: Self-pay

## 2021-09-02 ENCOUNTER — Encounter: Payer: Self-pay | Admitting: Family Medicine

## 2021-09-02 ENCOUNTER — Other Ambulatory Visit (HOSPITAL_COMMUNITY): Payer: Self-pay

## 2021-09-03 ENCOUNTER — Other Ambulatory Visit (HOSPITAL_COMMUNITY): Payer: Self-pay

## 2021-09-03 ENCOUNTER — Other Ambulatory Visit: Payer: Self-pay

## 2021-09-03 DIAGNOSIS — L02411 Cutaneous abscess of right axilla: Secondary | ICD-10-CM

## 2021-09-03 MED ORDER — DOXYCYCLINE HYCLATE 100 MG PO TABS
ORAL_TABLET | ORAL | 0 refills | Status: DC
  Filled 2021-09-03: qty 20, 10d supply, fill #0

## 2021-09-03 NOTE — Telephone Encounter (Signed)
I agree. Call in Doxycycline 100 mg BID for 10 days  ?

## 2021-09-04 ENCOUNTER — Ambulatory Visit: Payer: No Typology Code available for payment source | Admitting: Family Medicine

## 2021-09-15 ENCOUNTER — Other Ambulatory Visit (HOSPITAL_COMMUNITY): Payer: Self-pay

## 2021-09-29 ENCOUNTER — Other Ambulatory Visit (HOSPITAL_COMMUNITY): Payer: Self-pay

## 2021-09-29 MED ORDER — CLONAZEPAM 1 MG PO TABS
ORAL_TABLET | ORAL | 3 refills | Status: AC
Start: 2021-09-29 — End: ?
  Filled 2021-09-29: qty 90, 30d supply, fill #0

## 2021-09-29 MED ORDER — GABAPENTIN 300 MG PO CAPS
300.0000 mg | ORAL_CAPSULE | Freq: Every evening | ORAL | 4 refills | Status: AC
  Filled 2021-09-29: qty 30, 30d supply, fill #0

## 2021-09-29 MED ORDER — QUETIAPINE FUMARATE 300 MG PO TABS
300.0000 mg | ORAL_TABLET | Freq: Every evening | ORAL | 4 refills | Status: AC
  Filled 2021-09-29: qty 30, 30d supply, fill #0

## 2021-09-29 MED ORDER — VENLAFAXINE HCL ER 75 MG PO CP24
75.0000 mg | ORAL_CAPSULE | Freq: Every evening | ORAL | 4 refills | Status: AC
  Filled 2021-09-29: qty 30, 30d supply, fill #0

## 2021-09-29 MED ORDER — LAMOTRIGINE 200 MG PO TABS
200.0000 mg | ORAL_TABLET | Freq: Every evening | ORAL | 4 refills | Status: DC
  Filled 2021-09-29: qty 30, 30d supply, fill #0

## 2021-10-08 ENCOUNTER — Other Ambulatory Visit (HOSPITAL_COMMUNITY): Payer: Self-pay

## 2021-10-15 ENCOUNTER — Encounter: Payer: Self-pay | Admitting: Family Medicine

## 2021-10-15 DIAGNOSIS — E282 Polycystic ovarian syndrome: Secondary | ICD-10-CM

## 2021-10-15 NOTE — Telephone Encounter (Signed)
I did the referral

## 2021-10-21 ENCOUNTER — Other Ambulatory Visit (HOSPITAL_COMMUNITY): Payer: Self-pay

## 2021-10-21 MED ORDER — GABAPENTIN 300 MG PO CAPS
ORAL_CAPSULE | ORAL | 5 refills | Status: AC
  Filled 2021-10-29 – 2021-11-03 (×2): qty 60, 30d supply, fill #0

## 2021-10-21 MED ORDER — QUETIAPINE FUMARATE 300 MG PO TABS
ORAL_TABLET | ORAL | 4 refills | Status: AC
  Filled 2021-12-14: qty 30, 30d supply, fill #0
  Filled 2022-01-12: qty 30, 30d supply, fill #1
  Filled 2022-02-12: qty 30, 30d supply, fill #2
  Filled 2022-07-16: qty 30, 30d supply, fill #3

## 2021-10-21 MED ORDER — CLONAZEPAM 1 MG PO TABS
ORAL_TABLET | ORAL | 3 refills | Status: AC
  Filled 2021-10-29: qty 90, 30d supply, fill #0
  Filled 2021-11-26: qty 90, 30d supply, fill #1
  Filled 2021-12-29: qty 90, 30d supply, fill #2
  Filled 2022-01-29: qty 90, 30d supply, fill #3

## 2021-10-21 MED ORDER — VENLAFAXINE HCL ER 75 MG PO CP24
ORAL_CAPSULE | ORAL | 6 refills | Status: AC
  Filled 2021-11-30: qty 30, 30d supply, fill #0
  Filled 2021-12-29: qty 30, 30d supply, fill #1
  Filled 2022-01-29: qty 30, 30d supply, fill #2

## 2021-10-21 MED ORDER — GABAPENTIN 300 MG PO CAPS
ORAL_CAPSULE | ORAL | 5 refills | Status: AC
  Filled 2021-10-21: qty 60, 30d supply, fill #0
  Filled 2021-11-26: qty 60, 30d supply, fill #1
  Filled 2021-12-29: qty 60, 30d supply, fill #2
  Filled 2022-01-29: qty 60, 30d supply, fill #3

## 2021-10-21 MED ORDER — GABAPENTIN 300 MG PO CAPS: ORAL_CAPSULE | ORAL | 5 refills | Status: AC

## 2021-10-21 MED ORDER — LAMOTRIGINE 200 MG PO TABS
ORAL_TABLET | ORAL | 7 refills | Status: DC
  Filled 2021-10-21: qty 30, 30d supply, fill #0
  Filled 2021-12-14: qty 30, 30d supply, fill #1
  Filled 2022-01-12: qty 30, 30d supply, fill #2
  Filled 2022-02-12: qty 30, 30d supply, fill #3

## 2021-10-29 ENCOUNTER — Other Ambulatory Visit (HOSPITAL_COMMUNITY): Payer: Self-pay

## 2021-11-01 ENCOUNTER — Other Ambulatory Visit (HOSPITAL_COMMUNITY): Payer: Self-pay

## 2021-11-03 ENCOUNTER — Other Ambulatory Visit (HOSPITAL_BASED_OUTPATIENT_CLINIC_OR_DEPARTMENT_OTHER): Payer: Self-pay

## 2021-11-04 ENCOUNTER — Telehealth: Payer: Self-pay | Admitting: Family Medicine

## 2021-11-04 NOTE — Telephone Encounter (Signed)
Regarding referral for endocrinologist, The doctor doesn't have an appointement until the end of January 2024, requesting another provider that can get her in sooner

## 2021-11-05 NOTE — Telephone Encounter (Signed)
Please advise 

## 2021-11-06 NOTE — Telephone Encounter (Signed)
That's fine with me. Please forward this request to the referral coordinator

## 2021-11-20 ENCOUNTER — Other Ambulatory Visit (HOSPITAL_COMMUNITY): Payer: Self-pay

## 2021-11-20 ENCOUNTER — Encounter (HOSPITAL_COMMUNITY): Payer: Self-pay

## 2021-11-20 ENCOUNTER — Ambulatory Visit (HOSPITAL_COMMUNITY)
Admission: RE | Admit: 2021-11-20 | Discharge: 2021-11-20 | Disposition: A | Discharge status: Home / Self Care | Payer: No Typology Code available for payment source | Source: Ambulatory Visit | Attending: Physician Assistant | Admitting: Physician Assistant

## 2021-11-20 VITALS — BP 126/64 | HR 79 | Temp 98.2°F | Resp 17

## 2021-11-20 DIAGNOSIS — K219 Gastro-esophageal reflux disease without esophagitis: Secondary | ICD-10-CM | POA: Insufficient documentation

## 2021-11-20 DIAGNOSIS — Z6841 Body Mass Index (BMI) 40.0 and over, adult: Secondary | ICD-10-CM | POA: Diagnosis not present

## 2021-11-20 DIAGNOSIS — W57XXXA Bitten or stung by nonvenomous insect and other nonvenomous arthropods, initial encounter: Secondary | ICD-10-CM | POA: Insufficient documentation

## 2021-11-20 DIAGNOSIS — R5383 Other fatigue: Secondary | ICD-10-CM | POA: Diagnosis not present

## 2021-11-20 DIAGNOSIS — M2550 Pain in unspecified joint: Secondary | ICD-10-CM | POA: Insufficient documentation

## 2021-11-20 DIAGNOSIS — E282 Polycystic ovarian syndrome: Secondary | ICD-10-CM | POA: Diagnosis not present

## 2021-11-20 DIAGNOSIS — R110 Nausea: Secondary | ICD-10-CM | POA: Insufficient documentation

## 2021-11-20 DIAGNOSIS — L02411 Cutaneous abscess of right axilla: Secondary | ICD-10-CM

## 2021-11-20 DIAGNOSIS — S30861A Insect bite (nonvenomous) of abdominal wall, initial encounter: Secondary | ICD-10-CM | POA: Diagnosis present

## 2021-11-20 MED ORDER — ONDANSETRON HCL 4 MG PO TABS
4.0000 mg | ORAL_TABLET | Freq: Three times a day (TID) | ORAL | 0 refills | Status: DC | PRN
  Filled 2021-11-20: qty 20, 7d supply, fill #0

## 2021-11-20 MED ORDER — DOXYCYCLINE HYCLATE 100 MG PO TABS
ORAL_TABLET | ORAL | 0 refills | Status: DC
  Filled 2021-11-20: qty 20, 10d supply, fill #0

## 2021-11-20 NOTE — ED Provider Notes (Signed)
[place]    CSN: [id number] Arrival date & time: 11/20/21  1354      History   Chief Complaint Chief Complaint  Patient presents with   Tick Removal    Actually a tick bite and having symptoms of [place] spotted fever - have had before - Entered by patient   Nausea   Generalized Body Aches    HPI Kristi Ewing is a 26 y.o. female.   26 year old female presents with tick bite.  Patient relates that she was treated for [facility] spotted fever in 2017.  Patient relates that she pulled an attached tick off from her right flank area yesterday.  Patient relates since then she has been having fatigue, joint pain which is diffuse, nausea without vomiting.  Patient also relates that she has had some chills and sweats.  Patient denies fever, and relates that she is tolerating fluids well and eating.  Patient is concerned about having a repeat episode of RMSF, she request to have labs drawn to check for tick infection.  Patient relates she has not been around any family or friends that have been sick, she is not having any respiratory type symptoms, and she has not traveled recently.  Patient denies any rash on the hands or the feet.     Past Medical History:  Diagnosis Date   Asthma    childhood   Kidney stone    PCOS (polycystic ovarian syndrome)    sees Dr. Louretta Shorten     Patient Active Problem List   Diagnosis Date Noted   GERD (gastroesophageal reflux disease) 06/24/2021   Morbid obesity with body mass index of 40.0-49.9 ([place]) 01/18/2018   PCOS (polycystic ovarian syndrome) 12/24/2014   Anxiety state 10/15/2009    Past Surgical History:  Procedure Laterality Date   LAPAROSCOPIC CHOLECYSTECTOMY SINGLE PORT  01/19/2018   Procedure: LAPAROSCOPIC CHOLECYSTECTOMY SINGLE SITE WITH INTRAOPERATIVE CHOLANGIOGRAM;  Surgeon: Michael Boston, MD;  Location: [facility];  Service: General;;    OB History   No obstetric history on file.      Home Medications     Prior to Admission medications   Medication Sig Start Date End Date Taking? Authorizing Provider  ondansetron (ZOFRAN) 4 MG tablet Take 1 tablet (4 mg total) by mouth every 8 (eight) hours as needed for nausea or vomiting. 11/20/21  Yes [name], PA-C  clonazePAM (KLONOPIN) 1 MG tablet Take 1 tablet by mouth every morning and 2 tablets at bedtime 09/29/21     clonazePAM (KLONOPIN) 1 MG tablet Take 1 tablet by mouth in the morning and 2 tablets by mouth at bedtime 10/21/21     doxycycline (VIBRA-TABS) 100 MG tablet Take 1 tablet by mouth 2 times daily for 10 days. 11/20/21   [name], PA-C  gabapentin (NEURONTIN) 300 MG capsule Take 1 capsule (300 mg total) by mouth at bedtime. 06/17/21     gabapentin (NEURONTIN) 300 MG capsule Take 1 capsule (300 mg total) by mouth every evening. 09/29/21     gabapentin (NEURONTIN) 300 MG capsule Take 2 capsules by mouth at bedtime 10/21/21     gabapentin (NEURONTIN) 300 MG capsule Take 2 capsules by mouth at bedtime 10/21/21     gabapentin (NEURONTIN) 300 MG capsule Take 2 capsule by mouth at bedtime 10/21/21     lamoTRIgine (LAMICTAL) 200 MG tablet TAKE 1 TABLET BY MOUTH AT BEDTIME 06/13/20 06/13/21  [name], MD  lamoTRIgine (LAMICTAL) 200 MG tablet Take 1 tablet  by mouth once daily 06/17/21   [name], MD  lamoTRIgine (LAMICTAL) 200 MG tablet Take 1 tablet (200 mg total) by mouth every night. 09/29/21     lamoTRIgine (LAMICTAL) 200 MG tablet Take 1 tablet by mouth at bedtime 10/21/21     omeprazole (PRILOSEC) 40 MG capsule Take 1 capsule (40 mg total) by mouth in the morning. 06/24/21   [name], MD  QUEtiapine (SEROQUEL) 300 MG tablet TAKE 1 TABLET BY MOUTH ONCE DAILY 12/13/20 12/14/21  [name], MD  QUEtiapine (SEROQUEL) 300 MG tablet Take 1 tablet (300 mg total) by mouth every night. 09/29/21     QUEtiapine (SEROQUEL) 300 MG tablet Take 1 tablet by mouth at bedtime 10/21/21     venlafaxine XR (EFFEXOR XR) 75 MG 24 hr capsule Take 1 capsule  (75 mg total) by mouth every night. 09/29/21     venlafaxine XR (EFFEXOR XR) 75 MG 24 hr capsule Take 1 capsule by mouth every evening 10/21/21     venlafaxine XR (EFFEXOR-XR) 75 MG 24 hr capsule Take 1 capsule by mouth daily 03/05/21       Family History Family History  Problem Relation Age of Onset   Diabetes Other    Anxiety disorder Other    Hypertension Mother    Anxiety disorder Mother    Diabetes Father    Hypertension Father    Anxiety disorder Brother    Schizophrenia Maternal Uncle    Anxiety disorder Maternal Grandmother     Social History Social History   Tobacco Use   Smoking status: Never   Smokeless tobacco: Never  Vaping Use   Vaping Use: Never used  Substance Use Topics   Alcohol use: No    Alcohol/week: 0.0 standard drinks of alcohol   Drug use: No     Allergies   Hydrocodone bit-homatrop mbr   Review of Systems Review of Systems  Skin:  Positive for wound (Tick bite right flank).     Physical Exam Triage Vital Signs ED Triage Vitals [11/20/21 1419]  Enc Vitals Group     BP 126/64     Pulse Rate 79     Resp 17     Temp 98.2 F (36.8 C)     Temp src      SpO2 100 %     Weight      Height      Head Circumference      Peak Flow      Pain Score 5     Pain Loc      Pain Edu?      Excl. in [place]?    No data found.  Updated Vital Signs BP 126/64   Pulse 79   Temp 98.2 F (36.8 C)   Resp 17   SpO2 100%   Visual Acuity Right Eye Distance:   Left Eye Distance:   Bilateral Distance:    Right Eye Near:   Left Eye Near:    Bilateral Near:     Physical Exam Constitutional:      Appearance: Normal appearance.  HENT:     Mouth/Throat:     Mouth: Mucous membranes are moist.     Pharynx: Oropharynx is clear. No posterior oropharyngeal erythema.  Cardiovascular:     Rate and Rhythm: Normal rate and regular rhythm.     Heart sounds: Normal heart sounds.  Pulmonary:     Effort: Pulmonary effort is normal.     Breath sounds: Normal  air entry. Wheezing present. No rhonchi or rales.  Abdominal:       Comments: Abdomen: Mid abdomen outside flank area with a 0.125 x 0.125 slightly reddened area where the tick was attached, no swelling, no redness around the immediate area.  Lymphadenopathy:     Cervical: No cervical adenopathy.  Skin:    Comments: Bilateral hands and feet: No rashes noted bilaterally.  Neurological:     Mental Status: She is alert.      UC Treatments / Results  Labs (all labs ordered are listed, but only abnormal results are displayed) Labs Reviewed  ROCKY MTN SPOTTED FVR ABS PNL(IGG+IGM)  LYME DISEASE SEROLOGY W/REFLEX    EKG   Radiology No results found.  Procedures Procedures (including critical care time)  Medications Ordered in UC Medications - No data to display  Initial Impression / Assessment and Plan / UC Course  I have reviewed the triage vital signs and the nursing notes.  Pertinent labs & imaging results that were available during my care of the patient were reviewed by me and considered in my medical decision making (see chart for details).    Plan: 1.  [facility] spotted fever and Lyme's titer are pending 2.  Advised patient take Zofran 1 every 6 hours as needed for nausea 3.  Advised patient take doxycycline 100 mg twice daily until completed 4.  Advised patient to follow-up with PCP or return to urgent care if symptoms fail to improve. Final Clinical Impressions(s) / UC Diagnoses   Final diagnoses:  Tick bite of abdominal wall, initial encounter  Nausea without vomiting  Arthralgia, unspecified joint     Discharge Instructions      [facility] spotted fever and Lyme's titers have been drawn results should return in about 48 to 72 hours sometimes it takes a little bit longer.  You can check the results on MyChart. Advised take Zofran 1 every 6 hours as needed for nausea. Advised take doxycycline 100 mg 1 twice daily until completed. Advised to  follow-up PCP or return to urgent care if symptoms fail to improve.    ED Prescriptions     Medication Sig Dispense Auth. Provider   doxycycline (VIBRA-TABS) 100 MG tablet Take 1 tablet by mouth 2 times daily for 10 days. 20 tablet [name], PA-C   ondansetron (ZOFRAN) 4 MG tablet Take 1 tablet (4 mg total) by mouth every 8 (eight) hours as needed for nausea or vomiting. 20 tablet [name], PA-C      PDMP not reviewed this encounter.   [name], PA-C 11/20/21 1442

## 2021-11-20 NOTE — Discharge Instructions (Addendum)
[facility] spotted fever and Lyme's titers have been drawn results should return in about 48 to 72 hours sometimes it takes a little bit longer.  You can check the results on MyChart. Advised take Zofran 1 every 6 hours as needed for nausea. Advised take doxycycline 100 mg 1 twice daily until completed. Advised to follow-up PCP or return to urgent care if symptoms fail to improve.

## 2021-11-20 NOTE — ED Triage Notes (Signed)
Pt is present today with concerns for nausea and body aches, Pt states that she removed a tick from her right side yesterday. Pt states that she has experienced [place] spotted fever  in 2017

## 2021-11-21 LAB — LYME DISEASE SEROLOGY W/REFLEX: Lyme Total Antibody EIA: NEGATIVE

## 2021-11-24 LAB — ROCKY MTN SPOTTED FVR ABS PNL(IGG+IGM)
RMSF IgG: NEGATIVE
RMSF IgM: 0.74 index (ref 0.00–0.89)

## 2021-11-26 ENCOUNTER — Other Ambulatory Visit (HOSPITAL_COMMUNITY): Payer: Self-pay

## 2021-12-01 ENCOUNTER — Other Ambulatory Visit (HOSPITAL_COMMUNITY): Payer: Self-pay

## 2021-12-02 NOTE — Telephone Encounter (Signed)
Message resent to referral coordinator.  Teams message sent also.

## 2021-12-02 NOTE — Telephone Encounter (Signed)
Pt calling back to check on progress of this referral.

## 2021-12-15 ENCOUNTER — Other Ambulatory Visit (HOSPITAL_COMMUNITY): Payer: Self-pay

## 2021-12-29 ENCOUNTER — Other Ambulatory Visit (HOSPITAL_COMMUNITY): Payer: Self-pay

## 2022-01-13 ENCOUNTER — Other Ambulatory Visit (HOSPITAL_COMMUNITY): Payer: Self-pay

## 2022-01-13 ENCOUNTER — Encounter: Payer: Self-pay | Admitting: Family Medicine

## 2022-01-30 ENCOUNTER — Other Ambulatory Visit (HOSPITAL_COMMUNITY): Payer: Self-pay

## 2022-02-13 ENCOUNTER — Other Ambulatory Visit (HOSPITAL_COMMUNITY): Payer: Self-pay

## 2022-02-14 ENCOUNTER — Other Ambulatory Visit (HOSPITAL_COMMUNITY): Payer: Self-pay

## 2022-02-24 ENCOUNTER — Other Ambulatory Visit (HOSPITAL_COMMUNITY): Payer: Self-pay

## 2022-02-24 MED ORDER — GABAPENTIN 300 MG PO CAPS
600.0000 mg | ORAL_CAPSULE | Freq: Every evening | ORAL | 5 refills | Status: AC
  Filled 2022-02-24: qty 60, 30d supply, fill #0
  Filled 2022-03-31: qty 60, 30d supply, fill #1
  Filled 2022-04-30: qty 60, 30d supply, fill #2

## 2022-02-24 MED ORDER — CLONAZEPAM 1 MG PO TABS
1.0000 mg | ORAL_TABLET | Freq: Every day | ORAL | 4 refills | Status: AC | PRN
  Filled 2022-02-24: qty 40, 20d supply, fill #0
  Filled 2022-04-30: qty 40, 20d supply, fill #1

## 2022-02-24 MED ORDER — QUETIAPINE FUMARATE 300 MG PO TABS
300.0000 mg | ORAL_TABLET | Freq: Every evening | ORAL | 4 refills | Status: AC
  Filled 2022-02-24: qty 30, 30d supply, fill #0
  Filled 2022-04-15: qty 30, 30d supply, fill #1
  Filled 2022-05-14: qty 30, 30d supply, fill #2
  Filled 2022-08-15 (×2): qty 30, 30d supply, fill #3
  Filled 2022-10-04: qty 30, 30d supply, fill #4

## 2022-02-24 MED ORDER — LAMOTRIGINE 200 MG PO TABS
200.0000 mg | ORAL_TABLET | Freq: Every evening | ORAL | 7 refills | Status: DC
  Filled 2022-02-24: qty 30, 30d supply, fill #0
  Filled 2022-03-31: qty 30, 30d supply, fill #1
  Filled 2022-04-30: qty 30, 30d supply, fill #2

## 2022-02-24 MED ORDER — VENLAFAXINE HCL ER 75 MG PO CP24
75.0000 mg | ORAL_CAPSULE | Freq: Every evening | ORAL | 6 refills | Status: AC
  Filled 2022-02-24: qty 30, 30d supply, fill #0
  Filled 2022-03-31: qty 30, 30d supply, fill #1
  Filled 2022-05-04: qty 30, 30d supply, fill #2

## 2022-04-01 ENCOUNTER — Other Ambulatory Visit (HOSPITAL_COMMUNITY): Payer: Self-pay

## 2022-04-15 ENCOUNTER — Other Ambulatory Visit (HOSPITAL_COMMUNITY): Payer: Self-pay

## 2022-04-26 ENCOUNTER — Emergency Department (HOSPITAL_BASED_OUTPATIENT_CLINIC_OR_DEPARTMENT_OTHER)
Admission: EM | Admit: 2022-04-26 | Discharge: 2022-04-26 | Disposition: A | Discharge status: Home / Self Care | Payer: BC Managed Care – PPO | Attending: Emergency Medicine | Admitting: Emergency Medicine

## 2022-04-26 ENCOUNTER — Emergency Department (HOSPITAL_BASED_OUTPATIENT_CLINIC_OR_DEPARTMENT_OTHER): Payer: BC Managed Care – PPO

## 2022-04-26 DIAGNOSIS — K529 Noninfective gastroenteritis and colitis, unspecified: Secondary | ICD-10-CM | POA: Diagnosis not present

## 2022-04-26 DIAGNOSIS — Z1152 Encounter for screening for COVID-19: Secondary | ICD-10-CM | POA: Insufficient documentation

## 2022-04-26 DIAGNOSIS — D75839 Thrombocytosis, unspecified: Secondary | ICD-10-CM | POA: Insufficient documentation

## 2022-04-26 DIAGNOSIS — R112 Nausea with vomiting, unspecified: Secondary | ICD-10-CM | POA: Diagnosis present

## 2022-04-26 LAB — COMPREHENSIVE METABOLIC PANEL
ALT: 32 U/L (ref 0–44)
AST: 20 U/L (ref 15–41)
Albumin: 5.1 g/dL — ABNORMAL HIGH (ref 3.5–5.0)
Alkaline Phosphatase: 61 U/L (ref 38–126)
Anion gap: 16 — ABNORMAL HIGH (ref 5–15)
BUN: 11 mg/dL (ref 6–20)
CO2: 23 mmol/L (ref 22–32)
Calcium: 9.8 mg/dL (ref 8.9–10.3)
Chloride: 100 mmol/L (ref 98–111)
Creatinine, Ser: 0.79 mg/dL (ref 0.44–1.00)
GFR, Estimated: >60 mL/min (ref >60)
Glucose, Bld: 125 mg/dL — ABNORMAL HIGH (ref 70–99)
Potassium: 3.2 mmol/L — ABNORMAL LOW (ref 3.5–5.1)
Sodium: 139 mmol/L (ref 135–145)
Total Bilirubin: 1.1 mg/dL (ref 0.3–1.2)
Total Protein: 8 g/dL (ref 6.5–8.1)

## 2022-04-26 LAB — SARS CORONAVIRUS 2 BY RT PCR: SARS Coronavirus 2 by RT PCR: NEGATIVE

## 2022-04-26 LAB — LIPASE, BLOOD: Lipase: 17 U/L (ref 11–51)

## 2022-04-26 LAB — CBC
HCT: 42.2 % (ref 36.0–46.0)
Hemoglobin: 14.6 g/dL (ref 12.0–15.0)
MCH: 30.4 pg (ref 26.0–34.0)
MCHC: 34.6 g/dL (ref 30.0–36.0)
MCV: 87.7 fL (ref 80.0–100.0)
Platelets: 452 10*3/uL — ABNORMAL HIGH (ref 150–400)
RBC: 4.81 MIL/uL (ref 3.87–5.11)
RDW: 12.9 % (ref 11.5–15.5)
WBC: 13.3 10*3/uL — ABNORMAL HIGH (ref 4.0–10.5)
nRBC: 0 % (ref 0.0–0.2)

## 2022-04-26 LAB — HCG, SERUM, QUALITATIVE: Preg, Serum: NEGATIVE

## 2022-04-26 MED ORDER — IOHEXOL 300 MG/ML  SOLN
80.0000 mL | Freq: Once | INTRAMUSCULAR | Status: AC | PRN
  Administered 2022-04-26: 120 mL via INTRAVENOUS

## 2022-04-26 MED ORDER — ONDANSETRON HCL 8 MG PO TABS: 8.0000 mg | ORAL_TABLET | Freq: Three times a day (TID) | ORAL | 0 refills | Status: DC | PRN

## 2022-04-26 MED ORDER — ONDANSETRON HCL 4 MG/2ML IJ SOLN
4.0000 mg | Freq: Once | INTRAMUSCULAR | Status: AC | PRN
  Administered 2022-04-26: 4 mg via INTRAVENOUS
  Filled 2022-04-26: qty 2

## 2022-04-26 MED ORDER — PROCHLORPERAZINE EDISYLATE 10 MG/2ML IJ SOLN
10.0000 mg | INTRAMUSCULAR | Status: AC
  Administered 2022-04-26: 10 mg via INTRAVENOUS
  Filled 2022-04-26: qty 2

## 2022-04-26 MED ORDER — LACTATED RINGERS IV BOLUS
1000.0000 mL | Freq: Once | INTRAVENOUS | Status: AC
  Administered 2022-04-26: 1000 mL via INTRAVENOUS

## 2022-04-26 NOTE — ED Notes (Signed)
Asked pt if she was able to give a urine sample.  Pt reports having just gone to the bathroom and forgot to take her sample cup with her.  Reminded pt of need for urine sample.  Cup labelled at bedside in view of patient.

## 2022-04-26 NOTE — ED Triage Notes (Signed)
Pt c/o many episodes of vomiting without any abdominal pain since Friday lunch time. Pt reports unable to keep any food or liquids down since Friday lunchtime and feels progressively worsen and weakening since.  Reports "too many episodes of emesis to count" with no precipitating factors.  Pt denies fever at home and reports unable to tolerate any OTC medications to attempt symptom relief.

## 2022-04-26 NOTE — ED Provider Notes (Signed)
[facility] EMERGENCY DEPT Provider Note   CSN: [id number] Arrival date & time: 04/26/22  0847     History  Chief Complaint  Patient presents with   Emesis    Kristi Ewing is a 26 y.o. female.  HPI 26 year old female presents today complaining of nausea and vomiting and diarrhea for the past several days.  She reports she became sick on and symptoms began with nausea and vomiting.  She has been vomiting so frequently she has noted some blood in it.  She has had frequent loose green stools.  She has not had fever but has had some chills.  Denies any recent antibiotic use.  She works as a Merchandiser, retail.    Home Medications Prior to Admission medications   Medication Sig Start Date End Date Taking? Authorizing Provider  ondansetron (ZOFRAN) 8 MG tablet Take 1 tablet (8 mg total) by mouth every 8 (eight) hours as needed for nausea or vomiting. 04/26/22  Yes Pattricia Boss, MD  clonazePAM [name]) 1 MG tablet Take 1 tablet by mouth every morning and 2 tablets at bedtime 09/29/21     clonazePAM (KLONOPIN) 1 MG tablet Take 1 tablet by mouth in the morning and 2 tablets by mouth at bedtime 10/21/21     clonazePAM (KLONOPIN) 1 MG tablet Take 1 tablet by mouth daily as needed and take 1 tablet at bedtime 02/24/22     doxycycline (VIBRA-TABS) 100 MG tablet Take 1 tablet by mouth 2 times daily for 10 days. 11/20/21   [name], PA-C  gabapentin (NEURONTIN) 300 MG capsule Take 1 capsule (300 mg total) by mouth at bedtime. 06/17/21     gabapentin (NEURONTIN) 300 MG capsule Take 1 capsule (300 mg total) by mouth every evening. 09/29/21     gabapentin (NEURONTIN) 300 MG capsule Take 2 capsules by mouth at bedtime 10/21/21     gabapentin (NEURONTIN) 300 MG capsule Take 2 capsules by mouth at bedtime 10/21/21     gabapentin (NEURONTIN) 300 MG capsule Take 2 capsule by mouth at bedtime 10/21/21     gabapentin (NEURONTIN) 300 MG capsule Take 2 capsules (600 mg total) by mouth at bedtime.  02/24/22     lamoTRIgine (LAMICTAL) 200 MG tablet TAKE 1 TABLET BY MOUTH AT BEDTIME 06/13/20 06/13/21  [name], MD  lamoTRIgine (LAMICTAL) 200 MG tablet Take 1 tablet by mouth once daily 06/17/21   [name], MD  lamoTRIgine (LAMICTAL) 200 MG tablet Take 1 tablet (200 mg total) by mouth every night. 09/29/21     lamoTRIgine (LAMICTAL) 200 MG tablet Take 1 tablet by mouth at bedtime 10/21/21     lamoTRIgine (LAMICTAL) 200 MG tablet Take 1 tablet (200 mg total) by mouth at bedtime. 02/24/22     omeprazole (PRILOSEC) 40 MG capsule Take 1 capsule (40 mg total) by mouth in the morning. 06/24/21   [name], MD  QUEtiapine (SEROQUEL) 300 MG tablet TAKE 1 TABLET BY MOUTH ONCE DAILY 12/13/20 12/14/21  [name], MD  QUEtiapine (SEROQUEL) 300 MG tablet Take 1 tablet (300 mg total) by mouth every night. 09/29/21     QUEtiapine (SEROQUEL) 300 MG tablet Take 1 tablet by mouth at bedtime 10/21/21     QUEtiapine (SEROQUEL) 300 MG tablet Take 1 tablet (300 mg total) by mouth at bedtime. 02/24/22     venlafaxine XR (EFFEXOR XR) 75 MG 24 hr capsule Take 1 capsule (75 mg total) by mouth every night. 09/29/21  venlafaxine XR (EFFEXOR XR) 75 MG 24 hr capsule Take 1 capsule by mouth every evening 10/21/21     venlafaxine XR (EFFEXOR XR) 75 MG 24 hr capsule Take 1 capsule (75 mg total) by mouth every evening. 02/24/22     venlafaxine XR (EFFEXOR-XR) 75 MG 24 hr capsule Take 1 capsule by mouth daily 03/05/21         Allergies    Hydrocodone bit-homatrop mbr    Review of Systems   Review of Systems  Physical Exam Updated Vital Signs BP 121/60 (BP Location: Left Arm)   Pulse 68   Temp 98.9 F (37.2 C) (Oral)   Resp 16   SpO2 96%  Physical Exam Vitals reviewed.  Constitutional:      Appearance: Normal appearance.  HENT:     Right Ear: External ear normal.     Left Ear: External ear normal.  Eyes:     Pupils: Pupils are equal, round, and reactive to light.  Cardiovascular:     Rate and Rhythm:  Normal rate and regular rhythm.     Pulses: Normal pulses.  Pulmonary:     Effort: Pulmonary effort is normal.  Abdominal:     General: Abdomen is flat. Bowel sounds are normal.     Palpations: Abdomen is soft.     Tenderness: There is abdominal tenderness.     Comments: Moderate tenderness to palpation left lower quadrant no rebound  Musculoskeletal:        General: Normal range of motion.     Cervical back: Normal range of motion.  Skin:    General: Skin is warm.     Capillary Refill: Capillary refill takes less than 2 seconds.  Neurological:     General: No focal deficit present.     Mental Status: She is alert.  Psychiatric:        Mood and Affect: Mood normal.     ED Results / Procedures / Treatments   Labs (all labs ordered are listed, but only abnormal results are displayed) Labs Reviewed  COMPREHENSIVE METABOLIC PANEL - Abnormal; Notable for the following components:      Result Value   Potassium 3.2 (*)    Glucose, Bld 125 (*)    Albumin 5.1 (*)    Anion gap 16 (*)    All other components within normal limits  CBC - Abnormal; Notable for the following components:   WBC 13.3 (*)    Platelets 452 (*)    All other components within normal limits  SARS CORONAVIRUS 2 BY RT PCR  LIPASE, BLOOD  HCG, SERUM, QUALITATIVE  URINALYSIS, ROUTINE W REFLEX MICROSCOPIC  PREGNANCY, URINE    EKG None  Radiology CT ABDOMEN PELVIS W CONTRAST  Result Date: 04/26/2022 CLINICAL DATA:  Left lower quadrant pain 2 days with nausea and vomiting. EXAM: CT ABDOMEN AND PELVIS WITH CONTRAST TECHNIQUE: Multidetector CT imaging of the abdomen and pelvis was performed using the standard protocol following bolus administration of intravenous contrast. RADIATION DOSE REDUCTION: This exam was performed according to the departmental dose-optimization program which includes automated exposure control, adjustment of the mA and/or kV according to patient size and/or use of iterative reconstruction  technique. CONTRAST:  134m OMNIPAQUE IOHEXOL 300 MG/ML SOLN. (Two attempts that intravenous contrast administration with 20 mL on the first attempt and 100 mL on the second attempt). COMPARISON:  01/18/2018 FINDINGS: Lower chest: Lung bases are clear. Hepatobiliary: Previous cholecystectomy. Liver and biliary tree are otherwise unremarkable. Pancreas: Normal. Spleen: Normal.  Adrenals/Urinary Tract: Adrenal glands are normal. Kidneys are normal in size without hydronephrosis or nephrolithiasis. Ureters and bladder are normal. Stomach/Bowel: Stomach and small bowel are normal. Appendix is normal. Colon is normal. Vascular/Lymphatic: Abdominal aorta is normal in caliber. No evidence of adenopathy. Reproductive: IUD is in adequate position. Uterus and ovaries are otherwise unremarkable. Other: No free fluid or focal inflammatory change. Musculoskeletal: No focal abnormality. IMPRESSION: 1. No acute findings in the abdomen/pelvis. 2. Previous cholecystectomy. 3. IUD in adequate position. Electronically Signed   By: Marin Olp M.D.   On: 04/26/2022 10:59    Procedures Procedures    Medications Ordered in ED Medications  ondansetron (ZOFRAN) injection 4 mg (4 mg Intravenous Given 04/26/22 0928)  lactated ringers bolus 1,000 mL (0 mLs Intravenous Stopped 04/26/22 1151)  iohexol (OMNIPAQUE) 300 MG/ML solution 80 mL (120 mLs Intravenous Contrast Given 04/26/22 1035)  prochlorperazine (COMPAZINE) injection 10 mg (10 mg Intravenous Given 04/26/22 1152)    ED Course/ Medical Decision Making/ A&P Clinical Course as of 04/26/22 1318  Sun Apr 26, 2022  1000 CBC reviewed and interpreted and significant for leukocytosis at 13,300 and elevated platelets at 452,000 with hemoglobin normal [DR]    Clinical Course User Index [DR] Pattricia Boss, MD                           Medical Decision Making 26 year old female presents today complaining nausea vomiting diarrhea for several days.  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 Differential diagnosis includes was not limited to viral gastroenteritis, colitis, other diseases of the abdomen including appendicitis, gastritis, PID, other diseases of the female GU tract including UTI and pyelonephritis. Here she is evaluated with labs, CT scan, and is given IV fluids and antiemetics.  She has some mildly elevated white blood cell count Electrolytes are within normal limits CT scan shows no evidence of acute abnormality. Evaluation here most consistent with gastroenteritis Patient received IV fluids and is tolerating liquids without difficulty and feels improved at this time. We have discussed need for outpatient follow-up and return precautions  Amount and/or Complexity of Data Reviewed Labs: ordered. Decision-making details documented in ED Course. Radiology: ordered and independent interpretation performed. Decision-making details documented in ED Course.  Risk Prescription drug management.           Final Clinical Impression(s) / ED Diagnoses Final diagnoses:  Gastroenteritis    Rx / DC Orders ED Discharge Orders          Ordered    ondansetron (ZOFRAN) 8 MG tablet  Every 8 hours PRN        04/26/22 1318              Pattricia Boss, MD 04/26/22 1318

## 2022-04-28 ENCOUNTER — Telehealth: Payer: Self-pay

## 2022-04-28 NOTE — Telephone Encounter (Signed)
Transition Care Management Follow-up Telephone Call Date of discharge and from where: 04/26/22 from [facility] for  How have you been since you were released from the hospital? Pt states she feels much better now.  Any questions or concerns? No  Items Reviewed: Did the pt receive and understand the discharge instructions provided? Yes  Medications obtained and verified? Yes  Other?  N/a   Follow up appointments reviewed:  PCP Hospital f/u appt confirmed? No Pt declines f/u for ED visit at this time. Appt scheduled for CPE in [facility] f/u appt confirmed?  N/a   Are transportation arrangements needed? No  If their condition worsens, is the pt aware to call PCP or go to the Emergency Dept.? Yes Was the patient provided with contact information for the PCP's office or ED? Yes Was to pt encouraged to call back with questions or concerns? Yes

## 2022-05-01 ENCOUNTER — Other Ambulatory Visit (HOSPITAL_COMMUNITY): Payer: Self-pay

## 2022-05-02 ENCOUNTER — Other Ambulatory Visit (HOSPITAL_COMMUNITY): Payer: Self-pay

## 2022-05-04 ENCOUNTER — Other Ambulatory Visit (HOSPITAL_COMMUNITY): Payer: Self-pay

## 2022-05-26 ENCOUNTER — Other Ambulatory Visit (HOSPITAL_COMMUNITY): Payer: Self-pay

## 2022-05-26 MED ORDER — GABAPENTIN 300 MG PO CAPS
600.0000 mg | ORAL_CAPSULE | Freq: Every evening | ORAL | 5 refills | Status: AC
  Filled 2022-05-26: qty 60, 30d supply, fill #0
  Filled 2022-06-29: qty 60, 30d supply, fill #1
  Filled 2022-07-30: qty 60, 30d supply, fill #2

## 2022-05-26 MED ORDER — VENLAFAXINE HCL ER 75 MG PO CP24
75.0000 mg | ORAL_CAPSULE | Freq: Every evening | ORAL | 6 refills | Status: AC
  Filled 2022-05-26 – 2022-05-27 (×2): qty 30, 30d supply, fill #0
  Filled 2022-06-29: qty 30, 30d supply, fill #1
  Filled 2022-07-30: qty 30, 30d supply, fill #2
  Filled 2022-09-03: qty 30, 30d supply, fill #3

## 2022-05-26 MED ORDER — CLONAZEPAM 1 MG PO TABS
ORAL_TABLET | ORAL | 4 refills | Status: AC
  Filled 2022-05-26: qty 40, 20d supply, fill #0
  Filled 2022-06-29: qty 40, 20d supply, fill #1
  Filled 2022-07-30: qty 40, 20d supply, fill #2

## 2022-05-26 MED ORDER — LAMOTRIGINE 200 MG PO TABS
200.0000 mg | ORAL_TABLET | Freq: Every evening | ORAL | 7 refills | Status: DC
Start: 2022-05-26 — End: 2024-04-10
  Filled 2022-05-26: qty 30, 30d supply, fill #0
  Filled 2022-06-29: qty 30, 30d supply, fill #1
  Filled 2022-07-30: qty 30, 30d supply, fill #2

## 2022-05-26 MED ORDER — QUETIAPINE FUMARATE 100 MG PO TABS
300.0000 mg | ORAL_TABLET | Freq: Every evening | ORAL | 4 refills | Status: AC
  Filled 2022-05-26: qty 90, 30d supply, fill #0

## 2022-05-27 ENCOUNTER — Other Ambulatory Visit (HOSPITAL_COMMUNITY): Payer: Self-pay

## 2022-06-01 ENCOUNTER — Ambulatory Visit (INDEPENDENT_AMBULATORY_CARE_PROVIDER_SITE_OTHER): Payer: BC Managed Care – PPO | Admitting: Family Medicine

## 2022-06-01 ENCOUNTER — Encounter: Payer: Self-pay | Admitting: Family Medicine

## 2022-06-01 ENCOUNTER — Other Ambulatory Visit (HOSPITAL_COMMUNITY): Payer: Self-pay

## 2022-06-01 VITALS — BP 100/68 | HR 95 | Temp 97.6°F | Ht 67.75 in | Wt 305.0 lb

## 2022-06-01 DIAGNOSIS — Z23 Encounter for immunization: Secondary | ICD-10-CM | POA: Diagnosis not present

## 2022-06-01 DIAGNOSIS — Z0000 Encounter for general adult medical examination without abnormal findings: Secondary | ICD-10-CM | POA: Diagnosis not present

## 2022-06-01 LAB — CBC WITH DIFFERENTIAL/PLATELET
Basophils Absolute: 0.1 10*3/uL (ref 0.0–0.1)
Basophils Relative: 0.6 % (ref 0.0–3.0)
Eosinophils Absolute: 0.1 10*3/uL (ref 0.0–0.7)
Eosinophils Relative: 1.2 % (ref 0.0–5.0)
HCT: 43 % (ref 36.0–46.0)
Hemoglobin: 14.4 g/dL (ref 12.0–15.0)
Lymphocytes Relative: 28 % (ref 12.0–46.0)
Lymphs Abs: 2.7 10*3/uL (ref 0.7–4.0)
MCHC: 33.5 g/dL (ref 30.0–36.0)
MCV: 92.2 fl (ref 78.0–100.0)
Monocytes Absolute: 0.7 10*3/uL (ref 0.1–1.0)
Monocytes Relative: 7.2 % (ref 3.0–12.0)
Neutro Abs: 6 10*3/uL (ref 1.4–7.7)
Neutrophils Relative %: 63 % (ref 43.0–77.0)
Platelets: 372 10*3/uL (ref 150.0–400.0)
RBC: 4.67 Mil/uL (ref 3.87–5.11)
RDW: 14.1 % (ref 11.5–15.5)
WBC: 9.6 10*3/uL (ref 4.0–10.5)

## 2022-06-01 LAB — HEPATIC FUNCTION PANEL
ALT: 35 U/L (ref 0–35)
AST: 24 U/L (ref 0–37)
Albumin: 4.4 g/dL (ref 3.5–5.2)
Alkaline Phosphatase: 57 U/L (ref 39–117)
Bilirubin, Direct: 0 mg/dL (ref 0.0–0.3)
Total Bilirubin: 0.3 mg/dL (ref 0.2–1.2)
Total Protein: 7.2 g/dL (ref 6.0–8.3)

## 2022-06-01 LAB — LIPID PANEL
Cholesterol: 192 mg/dL (ref 0–200)
HDL: 43.4 mg/dL (ref >39.00)
LDL Cholesterol: 127 mg/dL — ABNORMAL HIGH (ref 0–99)
NonHDL: 148.49
Total CHOL/HDL Ratio: 4
Triglycerides: 109 mg/dL (ref 0.0–149.0)
VLDL: 21.8 mg/dL (ref 0.0–40.0)

## 2022-06-01 LAB — TSH: TSH: 0.73 u[IU]/mL (ref 0.35–5.50)

## 2022-06-01 LAB — BASIC METABOLIC PANEL
BUN: 11 mg/dL (ref 6–23)
CO2: 25 mEq/L (ref 19–32)
Calcium: 9.4 mg/dL (ref 8.4–10.5)
Chloride: 107 mEq/L (ref 96–112)
Creatinine, Ser: 0.8 mg/dL (ref 0.40–1.20)
GFR: 100.55 mL/min (ref >60.00)
Glucose, Bld: 102 mg/dL — ABNORMAL HIGH (ref 70–99)
Potassium: 4 mEq/L (ref 3.5–5.1)
Sodium: 139 mEq/L (ref 135–145)

## 2022-06-01 LAB — HEMOGLOBIN A1C: Hgb A1c MFr Bld: 5.4 % (ref 4.6–6.5)

## 2022-06-01 NOTE — Progress Notes (Signed)
   Subjective:    Patient ID: Kristi Ewing, female    DOB: 1995/08/25, 27 y.o.   MRN: [id number]  HPI Here for a well exam. She feels great. She had been dealing with a lot of nausea a few months ago, but this has resolved. She thinks this was due to stress, and after changing to a new job she feels much better. She is now a Science writer for [employer].    Review of Systems  Constitutional: Negative.   HENT: Negative.    Eyes: Negative.   Respiratory: Negative.    Cardiovascular: Negative.   Gastrointestinal: Negative.   Genitourinary:  Negative for decreased urine volume, difficulty urinating, dyspareunia, dysuria, enuresis, flank pain, frequency, hematuria, pelvic pain and urgency.  Musculoskeletal: Negative.   Skin: Negative.   Neurological: Negative.  Negative for headaches.  Psychiatric/Behavioral: Negative.         Objective:   Physical Exam Constitutional:      General: She is not in acute distress.    Appearance: She is well-developed. She is obese.  HENT:     Head: Normocephalic and atraumatic.     Right Ear: External ear normal.     Left Ear: External ear normal.     Nose: Nose normal.     Mouth/Throat:     Pharynx: No oropharyngeal exudate.  Eyes:     General: No scleral icterus.    Conjunctiva/sclera: Conjunctivae normal.     Pupils: Pupils are equal, round, and reactive to light.  Neck:     Thyroid: No thyromegaly.     Vascular: No JVD.  Cardiovascular:     Rate and Rhythm: Normal rate and regular rhythm.     Heart sounds: Normal heart sounds. No murmur heard.    No friction rub. No gallop.  Pulmonary:     Effort: Pulmonary effort is normal. No respiratory distress.     Breath sounds: Normal breath sounds. No wheezing or rales.  Chest:     Chest wall: No tenderness.  Abdominal:     General: Bowel sounds are normal. There is no distension.     Palpations: Abdomen is soft. There is no mass.     Tenderness: There is no abdominal  tenderness. There is no guarding or rebound.  Musculoskeletal:        General: No tenderness. Normal range of motion.     Cervical back: Normal range of motion and neck supple.  Skin:    General: Skin is warm and dry.     Findings: No erythema or rash.  Neurological:     Mental Status: She is alert and oriented to person, place, and time.     Cranial Nerves: No cranial nerve deficit.     Motor: No abnormal muscle tone.     Coordination: Coordination normal.     Deep Tendon Reflexes: Reflexes are normal and symmetric. Reflexes normal.  Psychiatric:        Behavior: Behavior normal.        Thought Content: Thought content normal.        Judgment: Judgment normal.           Assessment & Plan:  Well exam. We discussed diet and exercise. Get fasting labs. Alysia Penna, MD

## 2022-06-01 NOTE — Addendum Note (Signed)
Addended by: Gwenyth Ober R on: 06/01/2022 10:40 AM   Modules accepted: Orders

## 2022-06-29 ENCOUNTER — Other Ambulatory Visit: Payer: Self-pay

## 2022-07-30 ENCOUNTER — Other Ambulatory Visit: Payer: Self-pay

## 2022-08-15 ENCOUNTER — Other Ambulatory Visit (HOSPITAL_COMMUNITY): Payer: Self-pay

## 2022-08-24 ENCOUNTER — Other Ambulatory Visit (HOSPITAL_COMMUNITY): Payer: Self-pay

## 2022-08-24 MED ORDER — VENLAFAXINE HCL ER 75 MG PO CP24
75.0000 mg | ORAL_CAPSULE | Freq: Every evening | ORAL | 6 refills | Status: AC
  Filled 2022-08-24 – 2022-10-04 (×2): qty 30, 30d supply, fill #0
  Filled 2022-10-29: qty 30, 30d supply, fill #1
  Filled 2022-12-24 – 2023-05-10 (×2): qty 30, 30d supply, fill #2

## 2022-08-24 MED ORDER — GABAPENTIN 300 MG PO CAPS
900.0000 mg | ORAL_CAPSULE | Freq: Every day | ORAL | 5 refills | Status: AC
  Filled 2022-08-24: qty 90, 30d supply, fill #0
  Filled 2022-10-04: qty 90, 30d supply, fill #1
  Filled 2023-05-10: qty 90, 30d supply, fill #2
  Filled 2023-06-11: qty 90, 30d supply, fill #3

## 2022-08-24 MED ORDER — QUETIAPINE FUMARATE 100 MG PO TABS
300.0000 mg | ORAL_TABLET | Freq: Every day | ORAL | 4 refills | Status: AC
  Filled 2022-08-24: qty 90, 30d supply, fill #0
  Filled 2022-10-29: qty 90, 30d supply, fill #1
  Filled 2022-11-30: qty 90, 30d supply, fill #2
  Filled 2023-05-10: qty 90, 30d supply, fill #3
  Filled 2023-06-24: qty 90, 30d supply, fill #4

## 2022-08-24 MED ORDER — CLONAZEPAM 1 MG PO TABS
1.0000 mg | ORAL_TABLET | Freq: Three times a day (TID) | ORAL | 3 refills | Status: AC
  Filled 2022-08-24: qty 180, 60d supply, fill #0
  Filled 2022-10-29: qty 180, 60d supply, fill #1

## 2022-08-24 MED ORDER — CLONAZEPAM 0.5 MG PO TABS
0.5000 mg | ORAL_TABLET | Freq: Three times a day (TID) | ORAL | 4 refills | Status: DC
  Filled 2022-08-24: qty 90, 30d supply, fill #0

## 2022-08-24 MED ORDER — LAMOTRIGINE 200 MG PO TABS
200.0000 mg | ORAL_TABLET | Freq: Every day | ORAL | 7 refills | Status: DC
  Filled 2022-08-24: qty 30, 30d supply, fill #0
  Filled 2022-09-25: qty 30, 30d supply, fill #1
  Filled 2022-10-29: qty 30, 30d supply, fill #2
  Filled 2023-05-10: qty 30, 30d supply, fill #3

## 2022-10-05 ENCOUNTER — Other Ambulatory Visit: Payer: Self-pay

## 2022-10-15 ENCOUNTER — Other Ambulatory Visit (HOSPITAL_COMMUNITY): Payer: Self-pay

## 2022-10-15 MED ORDER — QUETIAPINE FUMARATE 100 MG PO TABS
ORAL_TABLET | ORAL | 3 refills | Status: AC
  Filled 2022-10-15 – 2022-12-26 (×4): qty 150, 30d supply, fill #0
  Filled 2023-01-27: qty 150, 30d supply, fill #1
  Filled 2023-03-01: qty 150, 30d supply, fill #2
  Filled 2023-04-02: qty 150, 30d supply, fill #3

## 2022-10-22 ENCOUNTER — Other Ambulatory Visit (HOSPITAL_COMMUNITY): Payer: Self-pay

## 2022-10-23 ENCOUNTER — Other Ambulatory Visit (HOSPITAL_COMMUNITY): Payer: Self-pay

## 2022-10-29 ENCOUNTER — Other Ambulatory Visit: Payer: Self-pay

## 2022-10-31 ENCOUNTER — Other Ambulatory Visit (HOSPITAL_COMMUNITY): Payer: Self-pay

## 2022-11-02 ENCOUNTER — Other Ambulatory Visit (HOSPITAL_COMMUNITY): Payer: Self-pay

## 2022-11-02 MED ORDER — LAMOTRIGINE 200 MG PO TABS
200.0000 mg | ORAL_TABLET | Freq: Every evening | ORAL | 7 refills | Status: DC
  Filled 2022-11-26 – 2022-11-30 (×2): qty 30, 30d supply, fill #0
  Filled 2022-12-24: qty 30, 30d supply, fill #1
  Filled 2023-01-27: qty 30, 30d supply, fill #2
  Filled 2023-03-01: qty 30, 30d supply, fill #3
  Filled 2023-04-02: qty 30, 30d supply, fill #4
  Filled 2023-06-11: qty 30, 30d supply, fill #5
  Filled 2023-07-13: qty 30, 30d supply, fill #6

## 2022-11-02 MED ORDER — GABAPENTIN 300 MG PO CAPS
900.0000 mg | ORAL_CAPSULE | Freq: Every evening | ORAL | 5 refills | Status: DC
  Filled 2022-11-02: qty 90, 30d supply, fill #0
  Filled 2022-11-26 – 2022-11-30 (×2): qty 90, 30d supply, fill #1
  Filled 2023-01-03: qty 90, 30d supply, fill #2
  Filled 2023-01-27: qty 90, 30d supply, fill #3
  Filled 2023-03-01: qty 90, 30d supply, fill #4
  Filled 2023-04-02: qty 90, 30d supply, fill #5

## 2022-11-02 MED ORDER — CLONAZEPAM 1 MG PO TABS
1.0000 mg | ORAL_TABLET | Freq: Three times a day (TID) | ORAL | 4 refills | Status: AC
  Filled 2022-12-24: qty 180, 60d supply, fill #0
  Filled 2023-01-27 – 2023-03-01 (×2): qty 180, 60d supply, fill #1

## 2022-11-02 MED ORDER — VENLAFAXINE HCL ER 75 MG PO CP24
75.0000 mg | ORAL_CAPSULE | Freq: Every evening | ORAL | 6 refills | Status: DC
  Filled 2022-11-26 – 2022-11-30 (×2): qty 30, 30d supply, fill #0
  Filled 2023-01-03: qty 30, 30d supply, fill #1
  Filled 2023-01-27: qty 30, 30d supply, fill #2
  Filled 2023-03-01: qty 30, 30d supply, fill #3
  Filled 2023-04-02: qty 30, 30d supply, fill #4
  Filled 2023-06-11: qty 30, 30d supply, fill #5
  Filled 2023-07-13: qty 30, 30d supply, fill #6

## 2022-11-27 ENCOUNTER — Other Ambulatory Visit: Payer: Self-pay

## 2022-11-27 ENCOUNTER — Other Ambulatory Visit (HOSPITAL_COMMUNITY): Payer: Self-pay

## 2022-11-30 ENCOUNTER — Other Ambulatory Visit (HOSPITAL_COMMUNITY): Payer: Self-pay

## 2022-12-05 ENCOUNTER — Encounter: Payer: Self-pay | Admitting: Family Medicine

## 2022-12-25 ENCOUNTER — Other Ambulatory Visit (HOSPITAL_COMMUNITY): Payer: Self-pay

## 2022-12-25 ENCOUNTER — Other Ambulatory Visit: Payer: Self-pay

## 2022-12-26 ENCOUNTER — Other Ambulatory Visit (HOSPITAL_COMMUNITY): Payer: Self-pay

## 2023-01-05 ENCOUNTER — Other Ambulatory Visit (HOSPITAL_COMMUNITY): Payer: Self-pay

## 2023-01-28 ENCOUNTER — Other Ambulatory Visit (HOSPITAL_BASED_OUTPATIENT_CLINIC_OR_DEPARTMENT_OTHER): Payer: Self-pay

## 2023-01-28 ENCOUNTER — Other Ambulatory Visit (HOSPITAL_COMMUNITY): Payer: Self-pay

## 2023-01-29 ENCOUNTER — Other Ambulatory Visit (HOSPITAL_COMMUNITY): Payer: Self-pay

## 2023-02-01 ENCOUNTER — Other Ambulatory Visit: Payer: Self-pay

## 2023-03-01 ENCOUNTER — Other Ambulatory Visit (HOSPITAL_COMMUNITY): Payer: Self-pay

## 2023-03-03 ENCOUNTER — Other Ambulatory Visit (HOSPITAL_COMMUNITY): Payer: Self-pay

## 2023-03-05 ENCOUNTER — Other Ambulatory Visit (HOSPITAL_COMMUNITY): Payer: Self-pay

## 2023-04-01 ENCOUNTER — Other Ambulatory Visit (HOSPITAL_COMMUNITY): Payer: Self-pay

## 2023-04-01 MED ORDER — ONDANSETRON HCL 4 MG PO TABS
4.0000 mg | ORAL_TABLET | Freq: Three times a day (TID) | ORAL | 0 refills | Status: DC | PRN
  Filled 2023-04-01: qty 12, 4d supply, fill #0

## 2023-04-02 ENCOUNTER — Other Ambulatory Visit (HOSPITAL_COMMUNITY): Payer: Self-pay

## 2023-04-11 ENCOUNTER — Encounter: Payer: Self-pay | Admitting: Family Medicine

## 2023-04-16 ENCOUNTER — Other Ambulatory Visit (HOSPITAL_COMMUNITY): Payer: Self-pay

## 2023-04-16 MED ORDER — METFORMIN HCL 500 MG PO TABS
500.0000 mg | ORAL_TABLET | Freq: Two times a day (BID) | ORAL | 3 refills | Status: DC
  Filled 2023-04-16: qty 60, 30d supply, fill #0
  Filled 2023-06-11: qty 60, 30d supply, fill #1

## 2023-04-16 NOTE — Telephone Encounter (Signed)
I agree. Call in Metformin 500 mg BID, #180 with 3 rf

## 2023-04-19 NOTE — Telephone Encounter (Signed)
Set up an in person OV to discuss all this

## 2023-05-10 ENCOUNTER — Other Ambulatory Visit (HOSPITAL_COMMUNITY): Payer: Self-pay

## 2023-05-11 ENCOUNTER — Other Ambulatory Visit (HOSPITAL_COMMUNITY): Payer: Self-pay

## 2023-06-11 ENCOUNTER — Other Ambulatory Visit (HOSPITAL_COMMUNITY): Payer: Self-pay | Admitting: Psychiatry

## 2023-06-15 ENCOUNTER — Other Ambulatory Visit (HOSPITAL_COMMUNITY): Payer: Self-pay

## 2023-06-25 ENCOUNTER — Other Ambulatory Visit (HOSPITAL_COMMUNITY): Payer: Self-pay

## 2023-06-25 ENCOUNTER — Other Ambulatory Visit: Payer: Self-pay

## 2023-07-05 ENCOUNTER — Encounter: Payer: Self-pay | Admitting: Family Medicine

## 2023-07-13 ENCOUNTER — Other Ambulatory Visit (HOSPITAL_COMMUNITY): Payer: Self-pay

## 2023-07-13 ENCOUNTER — Other Ambulatory Visit (HOSPITAL_COMMUNITY): Payer: Self-pay | Admitting: Psychiatry

## 2023-07-14 ENCOUNTER — Other Ambulatory Visit: Payer: Self-pay

## 2023-07-14 ENCOUNTER — Other Ambulatory Visit (HOSPITAL_COMMUNITY): Payer: Self-pay

## 2023-07-14 MED ORDER — GABAPENTIN 300 MG PO CAPS
900.0000 mg | ORAL_CAPSULE | Freq: Every evening | ORAL | 5 refills | Status: DC
  Filled 2023-07-14: qty 90, 30d supply, fill #0

## 2023-07-16 ENCOUNTER — Other Ambulatory Visit (HOSPITAL_COMMUNITY): Payer: Self-pay

## 2023-07-16 MED ORDER — GABAPENTIN 300 MG PO CAPS
900.0000 mg | ORAL_CAPSULE | Freq: Every day | ORAL | 1 refills | Status: AC
  Filled 2023-07-16: qty 90, 30d supply, fill #0

## 2023-07-16 MED ORDER — VENLAFAXINE HCL ER 75 MG PO CP24
75.0000 mg | ORAL_CAPSULE | Freq: Every evening | ORAL | 1 refills | Status: AC
  Filled 2023-07-16: qty 30, 30d supply, fill #0

## 2023-07-16 MED ORDER — LAMOTRIGINE 200 MG PO TABS
200.0000 mg | ORAL_TABLET | Freq: Every day | ORAL | 1 refills | Status: DC
  Filled 2023-07-16: qty 30, 30d supply, fill #0

## 2023-07-16 MED ORDER — QUETIAPINE FUMARATE 100 MG PO TABS
100.0000 mg | ORAL_TABLET | Freq: Every day | ORAL | 1 refills | Status: AC
  Filled 2023-07-16 – 2023-07-23 (×2): qty 150, 150d supply, fill #0

## 2023-07-19 ENCOUNTER — Other Ambulatory Visit (HOSPITAL_COMMUNITY): Payer: Self-pay

## 2023-07-19 MED ORDER — CLONAZEPAM 1 MG PO TABS
1.0000 mg | ORAL_TABLET | Freq: Three times a day (TID) | ORAL | 1 refills | Status: DC
  Filled 2023-07-19: qty 180, 60d supply, fill #0

## 2023-07-20 ENCOUNTER — Other Ambulatory Visit (HOSPITAL_COMMUNITY): Payer: Self-pay

## 2023-07-24 ENCOUNTER — Other Ambulatory Visit (HOSPITAL_COMMUNITY): Payer: Self-pay

## 2023-08-12 ENCOUNTER — Other Ambulatory Visit (HOSPITAL_COMMUNITY): Payer: Self-pay

## 2023-08-12 MED ORDER — QUETIAPINE FUMARATE 100 MG PO TABS
400.0000 mg | ORAL_TABLET | Freq: Every evening | ORAL | 3 refills | Status: AC
Start: 2023-08-12 — End: ?
  Filled 2023-08-12: qty 120, 30d supply, fill #0
  Filled 2023-09-16: qty 120, 30d supply, fill #1

## 2023-08-12 MED ORDER — VENLAFAXINE HCL ER 75 MG PO CP24
75.0000 mg | ORAL_CAPSULE | Freq: Every evening | ORAL | 4 refills | Status: AC
  Filled 2023-08-12: qty 30, 30d supply, fill #0
  Filled 2023-09-16: qty 30, 30d supply, fill #1

## 2023-08-12 MED ORDER — CLONAZEPAM 1 MG PO TABS
1.0000 mg | ORAL_TABLET | Freq: Two times a day (BID) | ORAL | 3 refills | Status: AC
  Filled 2023-08-12: qty 60, 30d supply, fill #0

## 2023-08-12 MED ORDER — GABAPENTIN 300 MG PO CAPS
900.0000 mg | ORAL_CAPSULE | Freq: Every evening | ORAL | 1 refills | Status: AC
Start: 2023-08-12 — End: ?
  Filled 2023-08-12: qty 90, 30d supply, fill #0
  Filled 2023-09-16: qty 90, 30d supply, fill #1

## 2023-08-12 MED ORDER — LAMOTRIGINE 200 MG PO TABS
200.0000 mg | ORAL_TABLET | Freq: Every evening | ORAL | 4 refills | Status: DC
  Filled 2023-08-12: qty 30, 30d supply, fill #0
  Filled 2023-09-16: qty 30, 30d supply, fill #1

## 2023-09-13 ENCOUNTER — Other Ambulatory Visit (HOSPITAL_COMMUNITY): Payer: Self-pay

## 2023-09-16 ENCOUNTER — Other Ambulatory Visit: Payer: Self-pay

## 2023-09-16 ENCOUNTER — Other Ambulatory Visit (HOSPITAL_COMMUNITY): Payer: Self-pay

## 2023-10-11 ENCOUNTER — Emergency Department (HOSPITAL_BASED_OUTPATIENT_CLINIC_OR_DEPARTMENT_OTHER)
Admission: EM | Admit: 2023-10-11 | Discharge: 2023-10-11 | Disposition: A | Discharge status: Home / Self Care | Attending: Emergency Medicine | Admitting: Emergency Medicine

## 2023-10-11 ENCOUNTER — Emergency Department (HOSPITAL_BASED_OUTPATIENT_CLINIC_OR_DEPARTMENT_OTHER)

## 2023-10-11 ENCOUNTER — Other Ambulatory Visit: Payer: Self-pay

## 2023-10-11 ENCOUNTER — Encounter (HOSPITAL_BASED_OUTPATIENT_CLINIC_OR_DEPARTMENT_OTHER): Payer: Self-pay | Admitting: Emergency Medicine

## 2023-10-11 DIAGNOSIS — J45909 Unspecified asthma, uncomplicated: Secondary | ICD-10-CM | POA: Insufficient documentation

## 2023-10-11 DIAGNOSIS — R1013 Epigastric pain: Secondary | ICD-10-CM | POA: Insufficient documentation

## 2023-10-11 DIAGNOSIS — D72829 Elevated white blood cell count, unspecified: Secondary | ICD-10-CM | POA: Insufficient documentation

## 2023-10-11 DIAGNOSIS — R1115 Cyclical vomiting syndrome unrelated to migraine: Secondary | ICD-10-CM | POA: Diagnosis not present

## 2023-10-11 DIAGNOSIS — R112 Nausea with vomiting, unspecified: Secondary | ICD-10-CM | POA: Diagnosis present

## 2023-10-11 LAB — DIFFERENTIAL
Abs Immature Granulocytes: 0.04 10*3/uL (ref 0.00–0.07)
Basophils Absolute: 0 10*3/uL (ref 0.0–0.1)
Basophils Relative: 0 %
Eosinophils Absolute: 0 10*3/uL (ref 0.0–0.5)
Eosinophils Relative: 0 %
Immature Granulocytes: 0 %
Lymphocytes Relative: 13 %
Lymphs Abs: 1.9 10*3/uL (ref 0.7–4.0)
Monocytes Absolute: 0.8 10*3/uL (ref 0.1–1.0)
Monocytes Relative: 5 %
Neutro Abs: 12.3 10*3/uL — ABNORMAL HIGH (ref 1.7–7.7)
Neutrophils Relative %: 82 %

## 2023-10-11 LAB — URINALYSIS, ROUTINE W REFLEX MICROSCOPIC
Bilirubin Urine: NEGATIVE
Glucose, UA: NEGATIVE mg/dL
Ketones, ur: >80 mg/dL — AB
Leukocytes,Ua: NEGATIVE
Nitrite: NEGATIVE
Protein, ur: 30 mg/dL — AB
Specific Gravity, Urine: >1.046 — ABNORMAL HIGH (ref 1.005–1.030)
pH: 7.5 (ref 5.0–8.0)

## 2023-10-11 LAB — CBC
HCT: 43.2 % (ref 36.0–46.0)
HCT: 43.8 % (ref 36.0–46.0)
Hemoglobin: 15 g/dL (ref 12.0–15.0)
Hemoglobin: 15.1 g/dL — ABNORMAL HIGH (ref 12.0–15.0)
MCH: 31.4 pg (ref 26.0–34.0)
MCH: 31.8 pg (ref 26.0–34.0)
MCHC: 34.7 g/dL (ref 30.0–36.0)
MCHC: 34.5 g/dL (ref 30.0–36.0)
MCV: 90.6 fL (ref 80.0–100.0)
MCV: 92.2 fL (ref 80.0–100.0)
Platelets: 513 10*3/uL — ABNORMAL HIGH (ref 150–400)
Platelets: 519 10*3/uL — ABNORMAL HIGH (ref 150–400)
RBC: 4.77 MIL/uL (ref 3.87–5.11)
RBC: 4.75 MIL/uL (ref 3.87–5.11)
RDW: 13.1 % (ref 11.5–15.5)
RDW: 13.2 % (ref 11.5–15.5)
WBC: 15.2 10*3/uL — ABNORMAL HIGH (ref 4.0–10.5)
WBC: 15.1 10*3/uL — ABNORMAL HIGH (ref 4.0–10.5)
nRBC: 0 % (ref 0.0–0.2)
nRBC: 0 % (ref 0.0–0.2)

## 2023-10-11 LAB — COMPREHENSIVE METABOLIC PANEL WITH GFR
ALT: 36 U/L (ref 0–44)
AST: 26 U/L (ref 15–41)
Albumin: 4.9 g/dL (ref 3.5–5.0)
Alkaline Phosphatase: 67 U/L (ref 38–126)
Anion gap: 20 — ABNORMAL HIGH (ref 5–15)
BUN: 12 mg/dL (ref 6–20)
CO2: 21 mmol/L — ABNORMAL LOW (ref 22–32)
Calcium: 10.6 mg/dL — ABNORMAL HIGH (ref 8.9–10.3)
Chloride: 98 mmol/L (ref 98–111)
Creatinine, Ser: 0.79 mg/dL (ref 0.44–1.00)
GFR, Estimated: >60 mL/min (ref >60)
Glucose, Bld: 97 mg/dL (ref 70–99)
Potassium: 3.7 mmol/L (ref 3.5–5.1)
Sodium: 138 mmol/L (ref 135–145)
Total Bilirubin: 0.8 mg/dL (ref 0.0–1.2)
Total Protein: 8.3 g/dL — ABNORMAL HIGH (ref 6.5–8.1)

## 2023-10-11 LAB — LIPASE, BLOOD: Lipase: 31 U/L (ref 11–51)

## 2023-10-11 LAB — HCG, SERUM, QUALITATIVE: Preg, Serum: NEGATIVE

## 2023-10-11 MED ORDER — LACTATED RINGERS IV BOLUS
1000.0000 mL | Freq: Once | INTRAVENOUS | Status: AC
  Administered 2023-10-11: 1000 mL via INTRAVENOUS

## 2023-10-11 MED ORDER — SUCRALFATE 1 G PO TABS
1.0000 g | ORAL_TABLET | Freq: Once | ORAL | Status: AC
  Administered 2023-10-11: 1 g via ORAL
  Filled 2023-10-11: qty 1

## 2023-10-11 MED ORDER — IOHEXOL 300 MG/ML  SOLN
100.0000 mL | Freq: Once | INTRAMUSCULAR | Status: AC | PRN
  Administered 2023-10-11: 100 mL via INTRAVENOUS

## 2023-10-11 MED ORDER — FAMOTIDINE IN NACL 20-0.9 MG/50ML-% IV SOLN
20.0000 mg | Freq: Once | INTRAVENOUS | Status: AC
  Administered 2023-10-11: 20 mg via INTRAVENOUS
  Filled 2023-10-11: qty 50

## 2023-10-11 MED ORDER — METOCLOPRAMIDE HCL 10 MG PO TABS: 10.0000 mg | ORAL_TABLET | Freq: Four times a day (QID) | ORAL | 0 refills | Status: AC

## 2023-10-11 MED ORDER — DROPERIDOL 2.5 MG/ML IJ SOLN
2.5000 mg | Freq: Once | INTRAMUSCULAR | Status: AC
  Administered 2023-10-11: 2.5 mg via INTRAVENOUS
  Filled 2023-10-11: qty 2

## 2023-10-11 MED ORDER — DIPHENHYDRAMINE HCL 50 MG/ML IJ SOLN
12.5000 mg | Freq: Once | INTRAMUSCULAR | Status: AC
  Administered 2023-10-11: 12.5 mg via INTRAVENOUS
  Filled 2023-10-11: qty 1

## 2023-10-11 MED ORDER — SUCRALFATE 1 G PO TABS: 1.0000 g | ORAL_TABLET | Freq: Two times a day (BID) | ORAL | 0 refills | Status: DC

## 2023-10-11 MED ORDER — ONDANSETRON HCL 4 MG/2ML IJ SOLN
4.0000 mg | Freq: Once | INTRAMUSCULAR | Status: AC
  Administered 2023-10-11: 4 mg via INTRAVENOUS
  Filled 2023-10-11: qty 2

## 2023-10-11 NOTE — ED Provider Notes (Signed)
 [facility] [facility] AT [facility] Provider Note   CSN: [id number] Arrival date & time: 10/11/23  1500     History  Chief Complaint  Patient presents with   Abdominal Pain   Emesis    Kristi Ewing is a 28 y.o. female.   Abdominal Pain Associated symptoms: vomiting   Emesis Associated symptoms: abdominal pain      28 year old female with medical history significant for PCOS, nephrolithiasis, bipolar disorder, asthma, THC use, chronic nausea and vomiting of unclear etiology who presents to the emergency department with nausea and vomiting epigastric pain.  The patient states that for the last 24 hours she has had uncontrolled nausea and vomiting.  She has had episodes of the same over the past 11 years with no formal diagnosis.  She does endorse using THC products but states that she started using THC products after this syndrome came on.  She has never been referred formally to a gastroenterologist for evaluation.  She denies any hematemesis.  She states that she has had her gallbladder out several years ago.  She denies any vaginal bleeding or vaginal discharge, denies any dysuria or frequency.  She denies any fevers, endorses chills and feeling subjectively warm.  She has had uncontrolled nausea and vomiting and has been unable to keep anything down.  She endorses pins-and-needles sensation throughout her face and arms bilaterally.  She endorses a history 3 weeks ago of passing out, no syncopal episode today.  Since she started retching, she has developed onset of epigastric abdominal discomfort that radiates to her back.  Pain is 4 out of 10 in severity. She denies any chest pain or shortness of breath.  Last menstrual period is unknown as she has an IUD in place.  Home Medications Prior to Admission medications   Medication Sig Start Date End Date Taking? Authorizing Provider  metoCLOPramide  (REGLAN ) 10 MG tablet Take 1 tablet (10 mg total) by mouth every  6 (six) hours. 10/11/23  Yes Rosealee Concha, MD  sucralfate  (CARAFATE ) 1 g tablet Take 1 tablet (1 g total) by mouth 2 (two) times daily for 10 days. 10/11/23 10/21/23 Yes Rosealee Concha, MD  clonazePAM  (KLONOPIN ) 0.5 MG tablet Take 1 tablet (0.5 mg total) by mouth 3 (three) times daily. 08/24/22     clonazePAM  (KLONOPIN ) 1 MG tablet Take 1 tablet by mouth every morning and 2 tablets at bedtime 09/29/21     clonazePAM  (KLONOPIN ) 1 MG tablet Take 1 tablet by mouth in the morning and 2 tablets by mouth at bedtime 10/21/21     clonazePAM  (KLONOPIN ) 1 MG tablet Take 1 tablet by mouth daily as needed and take 1 tablet at bedtime 02/24/22     clonazePAM  (KLONOPIN ) 1 MG tablet Take 1 tablet (1 mg total) by mouth at bedtime. May also take 1 tablet (1 mg total) daily as needed. 05/26/22     clonazePAM  (KLONOPIN ) 1 MG tablet Take 1 tablet (1 mg total) by mouth 3 (three) times daily. 08/24/22     clonazePAM  (KLONOPIN ) 1 MG tablet Take 1 tablet (1 mg) by mouth 3 times daily. 11/02/22     clonazePAM  (KLONOPIN ) 1 MG tablet Take 1 tablet (1 mg total) by mouth 2 (two) times daily. 08/12/23     gabapentin  (NEURONTIN ) 300 MG capsule Take 1 capsule (300 mg total) by mouth at bedtime. 06/17/21     gabapentin  (NEURONTIN ) 300 MG capsule Take 1 capsule (300 mg total) by mouth every evening. 09/29/21  gabapentin  (NEURONTIN ) 300 MG capsule Take 2 capsules by mouth at bedtime 10/21/21     gabapentin  (NEURONTIN ) 300 MG capsule Take 2 capsules by mouth at bedtime 10/21/21     gabapentin  (NEURONTIN ) 300 MG capsule Take 2 capsule by mouth at bedtime 10/21/21     gabapentin  (NEURONTIN ) 300 MG capsule Take 2 capsules (600 mg total) by mouth at bedtime. 02/24/22     gabapentin  (NEURONTIN ) 300 MG capsule Take 2 capsules (600 mg total) by mouth at bedtime. 05/26/22     gabapentin  (NEURONTIN ) 300 MG capsule Take 3 capsules (900 mg total) by mouth at bedtime. 08/24/22     gabapentin  (NEURONTIN ) 300 MG capsule Take 3 capsules (900 mg total) by mouth at  bedtime. 07/16/23     gabapentin  (NEURONTIN ) 300 MG capsule Take 3 capsules (900 mg total) by mouth at bedtime. 08/12/23     lamoTRIgine  (LAMICTAL ) 200 MG tablet TAKE 1 TABLET BY MOUTH AT BEDTIME 06/13/20 06/13/21  Henery Locket, MD  lamoTRIgine  (LAMICTAL ) 200 MG tablet Take 1 tablet by mouth once daily 06/17/21   Henery Locket, MD  lamoTRIgine  (LAMICTAL ) 200 MG tablet Take 1 tablet (200 mg total) by mouth every night. 09/29/21     lamoTRIgine  (LAMICTAL ) 200 MG tablet Take 1 tablet by mouth at bedtime 10/21/21     lamoTRIgine  (LAMICTAL ) 200 MG tablet Take 1 tablet (200 mg total) by mouth at bedtime. 02/24/22     lamoTRIgine  (LAMICTAL ) 200 MG tablet Take 1 tablet (200 mg total) by mouth at bedtime. 05/26/22     lamoTRIgine  (LAMICTAL ) 200 MG tablet Take 1 tablet (200 mg total) by mouth at bedtime. 08/24/22     lamoTRIgine  (LAMICTAL ) 200 MG tablet Take 1 tablet (200 mg total) by mouth at bedtime. 07/16/23     lamoTRIgine  (LAMICTAL ) 200 MG tablet Take 1 tablet (200 mg total) by mouth at bedtime. 08/12/23     levonorgestrel (KYLEENA) 19.5 MG IUD Take 1 device by intrauterine route. 10/27/18   [provider]  metFORMIN  (GLUCOPHAGE ) 500 MG tablet Take 1 tablet (500 mg total) by mouth 2 (two) times daily with a meal. 04/16/23   Donley Furth, MD  ondansetron  (ZOFRAN ) 4 MG tablet Take 1 tablet (4 mg total) by mouth every 8 (eight) hours as needed for nausea 04/01/23     QUEtiapine  (SEROQUEL ) 100 MG tablet Take 3 tablets (300 mg total) by mouth at bedtime. 05/26/22     QUEtiapine  (SEROQUEL ) 100 MG tablet Take 3 tablets (300 mg total) by mouth at bedtime. 08/24/22     QUEtiapine  (SEROQUEL ) 100 MG tablet Take 1 tablet (100 mg total) by mouth in the morning AND 4 tablets (400 mg total) at bedtime. 10/15/22   Plovsky, Doroteo Gasmen, MD  QUEtiapine  (SEROQUEL ) 100 MG tablet Take 1 tablet (100 mg) in morning and 4 tablets (400 mg) at bedtime 07/16/23     QUEtiapine  (SEROQUEL ) 100 MG tablet Take 4 tablets (400 mg total) by mouth at  bedtime. 08/12/23     QUEtiapine  (SEROQUEL ) 300 MG tablet TAKE 1 TABLET BY MOUTH ONCE DAILY 12/13/20 12/14/21  Henery Locket, MD  QUEtiapine  (SEROQUEL ) 300 MG tablet Take 1 tablet (300 mg total) by mouth every night. 09/29/21     QUEtiapine  (SEROQUEL ) 300 MG tablet Take 1 tablet by mouth at bedtime 10/21/21     QUEtiapine  (SEROQUEL ) 300 MG tablet Take 1 tablet (300 mg total) by mouth at bedtime. 02/24/22     venlafaxine  XR (EFFEXOR  XR) 75 MG  24 hr capsule Take 1 capsule (75 mg total) by mouth every night. 09/29/21     venlafaxine  XR (EFFEXOR  XR) 75 MG 24 hr capsule Take 1 capsule by mouth every evening 10/21/21     venlafaxine  XR (EFFEXOR  XR) 75 MG 24 hr capsule Take 1 capsule (75 mg total) by mouth every evening. 02/24/22     venlafaxine  XR (EFFEXOR  XR) 75 MG 24 hr capsule Take 1 capsule (75 mg total) by mouth every evening. 05/26/22     venlafaxine  XR (EFFEXOR  XR) 75 MG 24 hr capsule Take 1 capsule (75 mg total) by mouth every evening. 08/24/22     venlafaxine  XR (EFFEXOR  XR) 75 MG 24 hr capsule Take 1 capsule (75 mg total) by mouth every evening. 07/16/23     venlafaxine  XR (EFFEXOR  XR) 75 MG 24 hr capsule Take 1 capsule (75 mg total) by mouth every evening. 08/12/23     venlafaxine  XR (EFFEXOR -XR) 75 MG 24 hr capsule Take 1 capsule by mouth daily 03/05/21         Allergies    Hydrocodone  bit-homatrop mbr    Review of Systems   Review of Systems  Gastrointestinal:  Positive for abdominal pain and vomiting.  All other systems reviewed and are negative.   Physical Exam Updated Vital Signs BP 103/66   Pulse 99   Temp 97.8 F (36.6 C) (Oral)   Resp 18   SpO2 96%  Physical Exam Vitals and nursing note reviewed.  Constitutional:      General: She is not in acute distress.    Appearance: She is well-developed. She is obese.  HENT:     Head: Normocephalic and atraumatic.  Eyes:     Conjunctiva/sclera: Conjunctivae normal.  Cardiovascular:     Rate and Rhythm: Normal rate and regular rhythm.      Heart sounds: No murmur heard. Pulmonary:     Effort: Pulmonary effort is normal. No respiratory distress.     Breath sounds: Normal breath sounds.  Abdominal:     Palpations: Abdomen is soft.     Tenderness: There is abdominal tenderness in the epigastric area. There is no guarding or rebound.  Musculoskeletal:        General: No swelling.     Cervical back: Neck supple.  Skin:    General: Skin is warm and dry.     Capillary Refill: Capillary refill takes less than 2 seconds.  Neurological:     Mental Status: She is alert.  Psychiatric:        Mood and Affect: Mood normal.     ED Results / Procedures / Treatments   Labs (all labs ordered are listed, but only abnormal results are displayed) Labs Reviewed  CBC - Abnormal; Notable for the following components:      Result Value   WBC 15.2 (*)    Platelets 513 (*)    All other components within normal limits  URINALYSIS, ROUTINE W REFLEX MICROSCOPIC - Abnormal; Notable for the following components:   Specific Gravity, Urine >1.046 (*)    Hgb urine dipstick MODERATE (*)    Ketones, ur >80 (*)    Protein, ur 30 (*)    Bacteria, UA RARE (*)    All other components within normal limits  COMPREHENSIVE METABOLIC PANEL WITH GFR - Abnormal; Notable for the following components:   CO2 21 (*)    Calcium 10.6 (*)    Total Protein 8.3 (*)    Anion gap 20 (*)  All other components within normal limits  DIFFERENTIAL - Abnormal; Notable for the following components:   Neutro Abs 12.3 (*)    All other components within normal limits  CBC - Abnormal; Notable for the following components:   WBC 15.1 (*)    Hemoglobin 15.1 (*)    Platelets 519 (*)    All other components within normal limits  HCG, SERUM, QUALITATIVE  LIPASE, BLOOD  URINE DRUG SCREEN    EKG EKG Interpretation Date/Time:  Monday Oct 11 2023 15:37:18 EDT Ventricular Rate:  75 PR Interval:  116 QRS Duration:  86 QT Interval:  392 QTC Calculation: 437 R  Axis:   82  Text Interpretation: Normal sinus rhythm Normal ECG No previous ECGs available Confirmed by Rosealee Concha ([id number]) on 10/11/2023 6:32:21 PM  Radiology CT ABDOMEN PELVIS W CONTRAST Result Date: 10/11/2023 CLINICAL DATA:  Acute pancreatitis EXAM: CT ABDOMEN AND PELVIS WITH CONTRAST TECHNIQUE: Multidetector CT imaging of the abdomen and pelvis was performed using the standard protocol following bolus administration of intravenous contrast. RADIATION DOSE REDUCTION: This exam was performed according to the departmental dose-optimization program which includes automated exposure control, adjustment of the mA and/or kV according to patient size and/or use of iterative reconstruction technique. CONTRAST:  OMNIPAQUE  IOHEXOL  300 MG/ML  SOLN COMPARISON:  CT abdomen and pelvis 04/26/2022 FINDINGS: Lower chest: No acute abnormality. Hepatobiliary: No focal liver abnormality is seen. Status post cholecystectomy. No biliary dilatation. Pancreas: Unremarkable. No pancreatic ductal dilatation or surrounding inflammatory changes. Spleen: Normal in size without focal abnormality. Adrenals/Urinary Tract: Adrenal glands are unremarkable. Kidneys are normal, without renal calculi, focal lesion, or hydronephrosis. Bladder is unremarkable. Stomach/Bowel: Stomach is within normal limits. Appendix appears normal. No evidence of bowel wall thickening, distention, or inflammatory changes. Vascular/Lymphatic: No significant vascular findings are present. No enlarged abdominal or pelvic lymph nodes. Reproductive: There is an IUD in the uterus. The ovaries are within normal limits. Other: No abdominal wall hernia or abnormality. No abdominopelvic ascites. Musculoskeletal: No acute or significant osseous findings. IMPRESSION: 1. No acute localizing process in the abdomen or pelvis. 2. Status post cholecystectomy. 3. IUD in the uterus. Electronically Signed   By: Tyron Gallon M.D.   On: 10/11/2023 20:36     Procedures Procedures    Medications Ordered in ED Medications  ondansetron  (ZOFRAN ) injection 4 mg (4 mg Intravenous Given 10/11/23 1541)  lactated ringers  bolus 1,000 mL (0 mLs Intravenous Stopped 10/11/23 2047)  droperidol  (INAPSINE ) 2.5 MG/ML injection 2.5 mg (2.5 mg Intravenous Given 10/11/23 1906)  famotidine  (PEPCID ) IVPB 20 mg premix (0 mg Intravenous Stopped 10/11/23 2023)  sucralfate  (CARAFATE ) tablet 1 g (1 g Oral Given 10/11/23 1952)  iohexol  (OMNIPAQUE ) 300 MG/ML solution 100 mL (100 mLs Intravenous Contrast Given 10/11/23 1942)  diphenhydrAMINE  (BENADRYL ) injection 12.5 mg (12.5 mg Intravenous Given 10/11/23 2038)    ED Course/ Medical Decision Making/ A&P                                 Medical Decision Making Amount and/or Complexity of Data Reviewed Labs: ordered. Radiology: ordered.  Risk Prescription drug management.    28 year old female with medical history significant for PCOS, nephrolithiasis, bipolar disorder, asthma, THC use, chronic nausea and vomiting of unclear etiology who presents to the emergency department with nausea and vomiting epigastric pain.  The patient states that for the last 24 hours she has had uncontrolled nausea and vomiting.  She  has had episodes of the same over the past 11 years with no formal diagnosis.  She does endorse using THC products but states that she started using THC products after this syndrome came on.  She has never been referred formally to a gastroenterologist for evaluation.  She denies any hematemesis.  She states that she has had her gallbladder out several years ago.  She denies any vaginal bleeding or vaginal discharge, denies any dysuria or frequency.  She denies any fevers, endorses chills and feeling subjectively warm.  She has had uncontrolled nausea and vomiting and has been unable to keep anything down.  She endorses pins-and-needles sensation throughout her face and arms bilaterally.  She endorses a history 3 weeks  ago of passing out, no syncopal episode today.  Since she started retching, she has developed onset of epigastric abdominal discomfort that radiates to her back.  Pain is 4 out of 10 in severity. She denies any chest pain or shortness of breath.  Last menstrual period is unknown as she has an IUD in place.  On arrival, the patient was afebrile, not tachycardic or tachypneic, BP 154/86, saturating 97% on room air, sinus rhythm noted on cardiac telemetry.  Physical exam revealed mild epigastric tenderness to palpation, no rebound or guarding.  Initial EKG was performed given the patient's recent reported history of syncope which revealed sinus rhythm, ventricular rate 75, no acute ischemic changes or abnormal intervals.  Patient presentation consistent with either cyclic vomiting syndrome, cannabis hyperemesis syndrome, acute gastritis, acute viral infection, pancreatitis.  Lower concern for other acute intra-abdominal abnormality.  Given the patient's epigastric pain radiating to the back will obtain lipase in addition to CT imaging of the abdomen and pelvis.  Normal QTc, patient states still persistently nauseated despite Zofran , will proceed with administration of IV droperidol  given the patient's concurrent THC use.  Analysis and UDS also ordered.  Labs: CBC with a leukocytosis to 15.2, could be reactive or could be due to infectious abnormality.  CMP revealed no significant electrolyte abnormality, mild anion gap acidosis with a bicarbonate of 21, anion gap of 20, suspect likely mild starvation ketosis in the setting of uncontrolled nausea and vomiting and decreased p.o. intake, mild hypercalcemia to 10.6, normal sodium, and normal potassium, normal chloride, glucose 97, creatinine normal, LFTs normal and T. bili normal.  hCG negative.  CT abdomen pelvis: IMPRESSION:  1. No acute localizing process in the abdomen or pelvis.  2. Status post cholecystectomy.  3. IUD in the uterus.   UA: Negative  for UTI, UDS pending.  Patient tolerated oral intake, feeling significantly symptomatically improved following the above interventions.  Symptoms consistent with likely cyclic vomiting syndrome versus cannabis hyperemesis syndrome. Will prescribe Carafate  and Reglan  and have the patient follow-up with gastroenterology outpatient for continued management.  Return precautions provided.  Final Clinical Impression(s) / ED Diagnoses Final diagnoses:  Cyclic vomiting syndrome    Rx / DC Orders ED Discharge Orders          Ordered    Ambulatory referral to Gastroenterology        10/11/23 2106    sucralfate  (CARAFATE ) 1 g tablet  2 times daily        10/11/23 2108    metoCLOPramide  (REGLAN ) 10 MG tablet  Every 6 hours        10/11/23 2108              Rosealee Concha, MD 10/11/23 2114

## 2023-10-11 NOTE — ED Notes (Signed)
 Ambulatory to restroom

## 2023-10-11 NOTE — ED Notes (Signed)
Pt states she is still unable to provide a urine sample. 

## 2023-10-11 NOTE — ED Notes (Signed)
 Lt green re-drawn

## 2023-10-11 NOTE — ED Triage Notes (Addendum)
 Emesis x24 hrs. HX same episodes x11 years- no formal diagnosis. At times feels pins and needles in face and arms. In previous episodes (3 weeks ago) has passed out. Typically takes zofran  PO- last dose 0700.

## 2023-10-11 NOTE — ED Notes (Signed)
 States vomiting resistant to zofran 

## 2023-10-11 NOTE — Discharge Instructions (Addendum)
 Recommend you stop all cannabis and/or THC containing products, follow-up with gastroenterology outpatient.  Your CT imaging and laboratory evaluation was overall reassuring and you are able to tolerate oral intake with improvement in symptoms after management in the ER.  Return for any severe worsening symptoms, inability to tolerate oral intake as this can lead to dehydration.

## 2023-10-12 LAB — URINE DRUG SCREEN
Amphetamines: DETECTED — AB
Barbiturates: NOT DETECTED
Benzodiazepines: NOT DETECTED
Cocaine: NOT DETECTED
Fentanyl: NOT DETECTED
Methadone Scn, Ur: NOT DETECTED
Opiates: NOT DETECTED
Tetrahydrocannabinol: DETECTED — AB

## 2023-10-19 ENCOUNTER — Other Ambulatory Visit (HOSPITAL_COMMUNITY): Payer: Self-pay | Admitting: Psychiatry

## 2024-02-14 ENCOUNTER — Other Ambulatory Visit (HOSPITAL_COMMUNITY): Payer: Self-pay

## 2024-02-14 MED ORDER — ONDANSETRON HCL 4 MG PO TABS
ORAL_TABLET | ORAL | 0 refills | Status: DC
  Filled 2024-02-14: qty 12, 4d supply, fill #0

## 2024-04-10 ENCOUNTER — Ambulatory Visit: Admitting: Family Medicine

## 2024-04-10 VITALS — BP 110/64 | HR 72 | Temp 98.8°F | Wt 256.0 lb

## 2024-04-10 DIAGNOSIS — R1115 Cyclical vomiting syndrome unrelated to migraine: Secondary | ICD-10-CM

## 2024-04-10 MED ORDER — ONDANSETRON HCL 8 MG PO TABS: 8.0000 mg | ORAL_TABLET | Freq: Four times a day (QID) | ORAL | 5 refills | Status: AC | PRN

## 2024-04-10 NOTE — Progress Notes (Signed)
   Subjective:    Patient ID: Kristi Ewing, female    DOB: Jun 25, 1995, 28 y.o.   MRN: [id number]  HPI Here for help with cyclic vomiting syndrome. She has chronic nausea with vomiting that comes and goes. She takes a probiotic and a prebiotic daily. She was in the ED in May for this, and she was treated with IV Zofran  and Reglan . She says Zofran  helps the most, and she asks for a refill. An referral to GI was placed at that ED visit, but it expired with no action being taken.    Review of Systems  Constitutional: Negative.   Respiratory: Negative.    Cardiovascular: Negative.   Gastrointestinal:  Positive for nausea and vomiting. Negative for abdominal distention, abdominal pain, blood in stool, constipation and diarrhea.       Objective:   Physical Exam Constitutional:      Appearance: Normal appearance. She is not ill-appearing.  Cardiovascular:     Rate and Rhythm: Normal rate and regular rhythm.     Pulses: Normal pulses.     Heart sounds: Normal heart sounds.  Pulmonary:     Effort: Pulmonary effort is normal.     Breath sounds: Normal breath sounds.  Abdominal:     General: Abdomen is flat. Bowel sounds are normal. There is no distension.     Palpations: Abdomen is soft. There is no mass.     Tenderness: There is no abdominal tenderness. There is no guarding or rebound.     Hernia: No hernia is present.  Neurological:     Mental Status: She is alert.           Assessment & Plan:  Cyclic vomiting syndrome. We refilled Zofran  8 mg to use as needed. We also placed a new referral to GI for this.  Garnette Olmsted, MD

## 2024-04-21 ENCOUNTER — Other Ambulatory Visit (HOSPITAL_COMMUNITY): Payer: Self-pay | Admitting: Psychiatry

## 2024-06-16 ENCOUNTER — Encounter: Payer: Self-pay | Admitting: Gastroenterology

## 2024-06-28 ENCOUNTER — Encounter: Payer: Self-pay | Admitting: Gastroenterology

## 2024-06-28 ENCOUNTER — Encounter: Payer: Self-pay | Admitting: Family Medicine

## 2024-07-17 ENCOUNTER — Ambulatory Visit: Admitting: Gastroenterology
